# Patient Record
Sex: Male | Born: 1955 | Race: White | Hispanic: No | Marital: Married | Smoking: Never smoker
Health system: Southern US, Community
[De-identification: ages and names within clinical notes are randomized; demographics above are authoritative.]

## PROBLEM LIST (undated history)

## (undated) DIAGNOSIS — E785 Hyperlipidemia, unspecified: Secondary | ICD-10-CM

## (undated) DIAGNOSIS — A6000 Herpesviral infection of urogenital system, unspecified: Secondary | ICD-10-CM

## (undated) HISTORY — DX: Herpesviral infection of urogenital system, unspecified: A60.00

## (undated) HISTORY — DX: Hyperlipidemia, unspecified: E78.5

## (undated) HISTORY — PX: TONSILLECTOMY: SUR1361

---
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
## 1998-02-10 ENCOUNTER — Ambulatory Visit (HOSPITAL_COMMUNITY): Admission: RE | Admit: 1998-02-10 | Discharge: 1998-02-10 | Payer: Self-pay | Admitting: *Deleted

## 2001-09-04 ENCOUNTER — Ambulatory Visit (HOSPITAL_COMMUNITY): Admission: RE | Admit: 2001-09-04 | Discharge: 2001-09-04 | Payer: Self-pay | Admitting: *Deleted

## 2004-04-21 ENCOUNTER — Encounter: Admission: RE | Admit: 2004-04-21 | Discharge: 2004-04-21 | Payer: Self-pay | Admitting: Family Medicine

## 2004-07-20 ENCOUNTER — Ambulatory Visit (HOSPITAL_COMMUNITY): Admission: RE | Admit: 2004-07-20 | Discharge: 2004-07-20 | Payer: Self-pay | Admitting: Neurological Surgery

## 2004-08-06 ENCOUNTER — Encounter: Admission: RE | Admit: 2004-08-06 | Discharge: 2004-08-06 | Payer: Self-pay | Admitting: Neurological Surgery

## 2004-10-17 ENCOUNTER — Emergency Department (HOSPITAL_COMMUNITY): Admission: EM | Admit: 2004-10-17 | Discharge: 2004-10-17 | Payer: Self-pay | Admitting: Family Medicine

## 2004-10-26 ENCOUNTER — Ambulatory Visit (HOSPITAL_COMMUNITY): Admission: RE | Admit: 2004-10-26 | Discharge: 2004-10-26 | Payer: Self-pay | Admitting: Specialist

## 2004-11-01 ENCOUNTER — Ambulatory Visit (HOSPITAL_COMMUNITY): Admission: RE | Admit: 2004-11-01 | Discharge: 2004-11-01 | Payer: Self-pay | Admitting: Specialist

## 2006-08-28 ENCOUNTER — Ambulatory Visit (HOSPITAL_COMMUNITY): Admission: RE | Admit: 2006-08-28 | Discharge: 2006-08-28 | Payer: Self-pay | Admitting: *Deleted

## 2010-01-18 ENCOUNTER — Emergency Department (HOSPITAL_COMMUNITY): Admission: EM | Admit: 2010-01-18 | Discharge: 2010-01-19 | Payer: Self-pay | Admitting: Emergency Medicine

## 2010-09-19 ENCOUNTER — Encounter: Payer: Self-pay | Admitting: Family Medicine

## 2010-11-15 LAB — D-DIMER, QUANTITATIVE: D-Dimer, Quant: 0.22 ug/mL-FEU (ref 0.00–0.48)

## 2011-01-14 NOTE — Procedures (Signed)
[place]. [facility]  Patient:    Crawford, Joe Visit Number: [id number] MRN: [id number]          Service Type: END Location: ENDO Attending Physician:  Sabino Gasser Dictated by:   Sabino Gasser, M.D. Proc. Date: 09/04/01 Admit Date:  09/04/2001                             Procedure Report  PROCEDURE PERFORMED:  Colonoscopy.  ENDOSCOPIST:  Sabino Gasser, M.D.  INDICATIONS FOR PROCEDURE:  Colon polyps.  ANESTHESIA:  Demerol 100 mg, Versed 10 mg.  DESCRIPTION OF PROCEDURE:  With the patient mildly sedated in the left lateral decubitus position, the Olympus videoscopic colonoscope was inserted in the rectum after normal rectal examination and passed to the cecum.  The cecum was identified by the ileocecal valve and appendiceal orifice.  We entered into the terminal ileum which also appeared normal.  From this point, the colonoscope was slowly withdrawn, taking circumferential views of the entire colonic mucosa, stopping in the rectum which appeared normal on direct view and showed possibly small internal hemorrhoids on retroflex view. The endoscope was straightened and withdrawn.  Patients vital signs and pulse oximeter remained stable.  The patient tolerated the procedure well and without apparent complications.  FINDINGS:  Very small internal hemorrhoids.  Otherwise unremarkable examination.  PLAN:  Repeat examination possibly in five years. Dictated by:   Sabino Gasser, M.D. Attending Physician:  Sabino Gasser DD:  09/04/01 TD:  09/04/01 Job: [id number]

## 2011-01-14 NOTE — Op Note (Signed)
NAME:  Joe Crawford, Joe Crawford NO.:  [id number]   MEDICAL RECORD NO.:  [id number]          PATIENT TYPE:  AMB   LOCATION:  ENDO                         FACILITY:  [facility]   PHYSICIAN:  Georgiana Spinner, M.D.    DATE OF BIRTH:  1956/04/11   DATE OF PROCEDURE:  08/28/2006  DATE OF DISCHARGE:  06/26/2006                               OPERATIVE REPORT   PROCEDURE:  Colonoscopy.   ANESTHESIA:  Fentanyl 125 mcg, Versed 12 mg.   PROCEDURE:  With the patient mildly sedated in the left lateral  decubitus position, a rectal examination was attempted and the prostate  felt normal as much as I could palpate on rectal examination.  Subsequently the Pentax videoscopic colonoscope was inserted into the  rectum and passed under direct vision with pressure applied to the  abdomen.  We were able to reach the cecum as identified by ileocecal  valve and appendiceal orifice both which were photographed.  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  The endoscope was straightened and withdrawn.  The patient's vital signs, pulse oximeter remained stable.  The patient  tolerated procedure well without apparent complications.   FINDINGS:  Arteriovenous malformations of hepatic flexure, otherwise  unremarkable examination.   PLAN:  Repeat examination probably in 5 years.           ______________________________  Georgiana Spinner, M.D.     GMO/MEDQ  D:  08/28/2006  T:  08/28/2006  Job:  [id number]   cc:   Dellis Anes. Idell Pickles, M.D.

## 2011-03-15 ENCOUNTER — Other Ambulatory Visit: Payer: Self-pay | Admitting: Family Medicine

## 2011-03-15 ENCOUNTER — Ambulatory Visit
Admission: RE | Admit: 2011-03-15 | Discharge: 2011-03-15 | Disposition: A | Discharge status: Home / Self Care | Payer: BC Managed Care – PPO | Source: Ambulatory Visit | Attending: Family Medicine | Admitting: Family Medicine

## 2011-03-15 DIAGNOSIS — M79669 Pain in unspecified lower leg: Secondary | ICD-10-CM

## 2012-07-14 IMAGING — US US EXTREM LOW VENOUS*R*
1 series · 14 of 24 positions shown · non-contrast
Comparison: None

CLINICAL DATA: Right leg pain and swelling

RIGHT LOWER EXTREMITY VENOUS DOPPLER ULTRASOUND
TECHNIQUE: Gray-scale sonography with compression, as well as color
and duplex ultrasound, were performed to evaluate the deep venous
system from the level of the common femoral vein through the
popliteal and proximal calf veins.

[Series 1: us extrem low venous*right* · 14 of 31 slices shown]
[im 1/31]
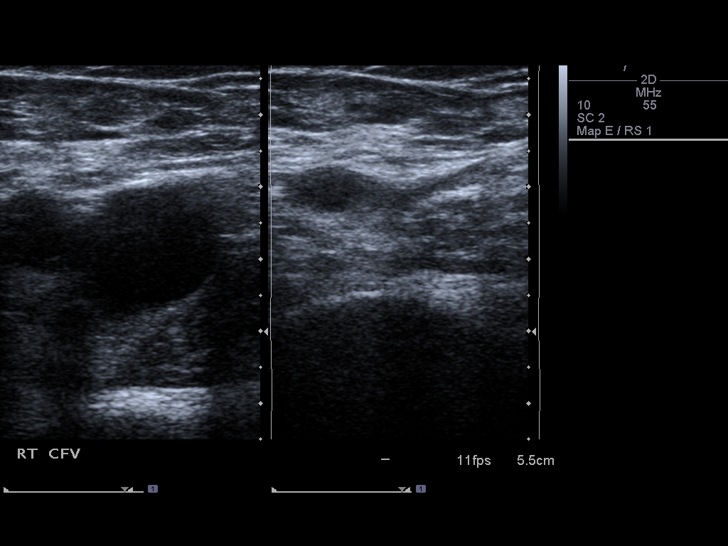
[im 3/31]
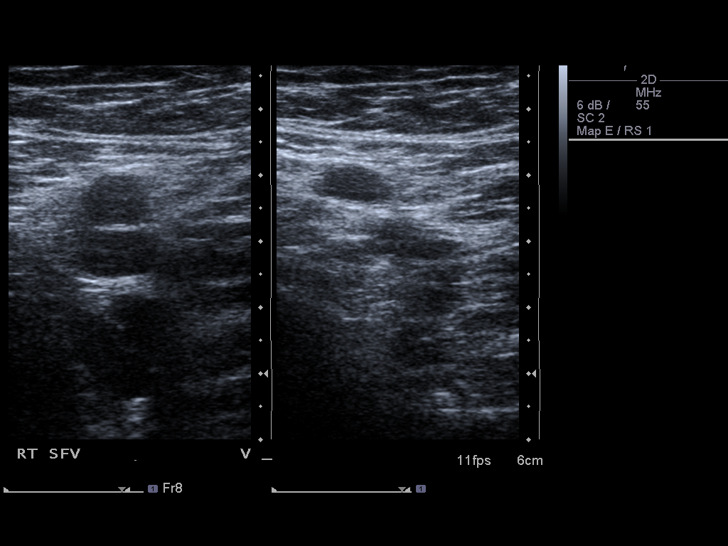
[im 6/31]
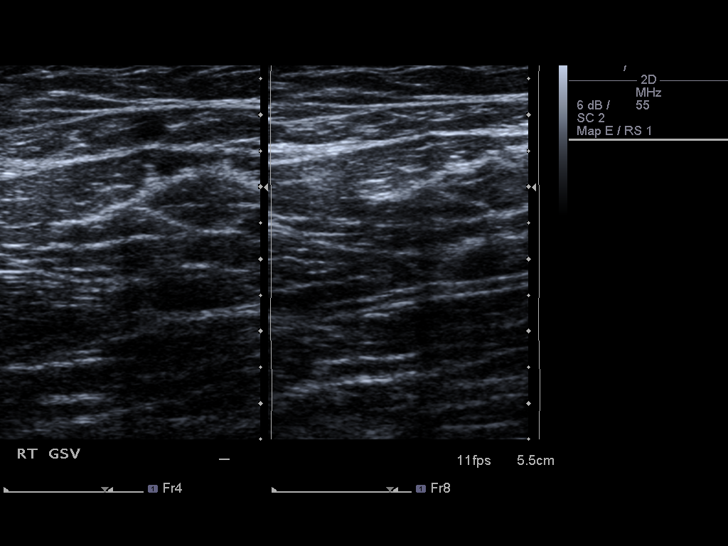
[im 8/31]
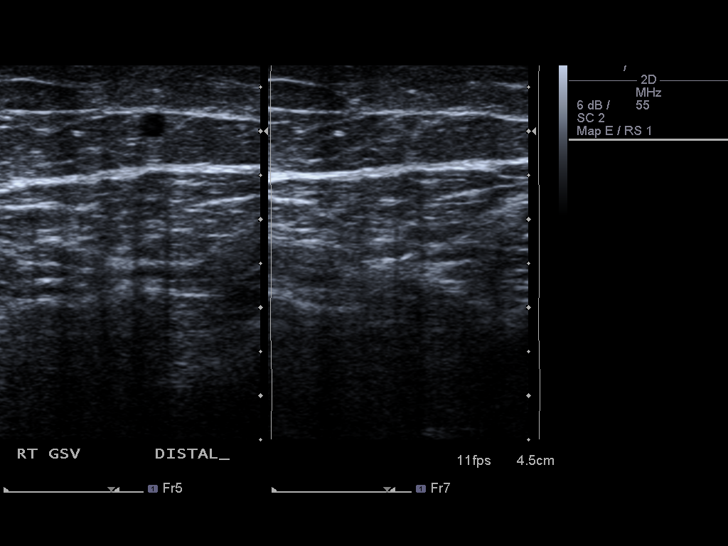
[im 10/31]
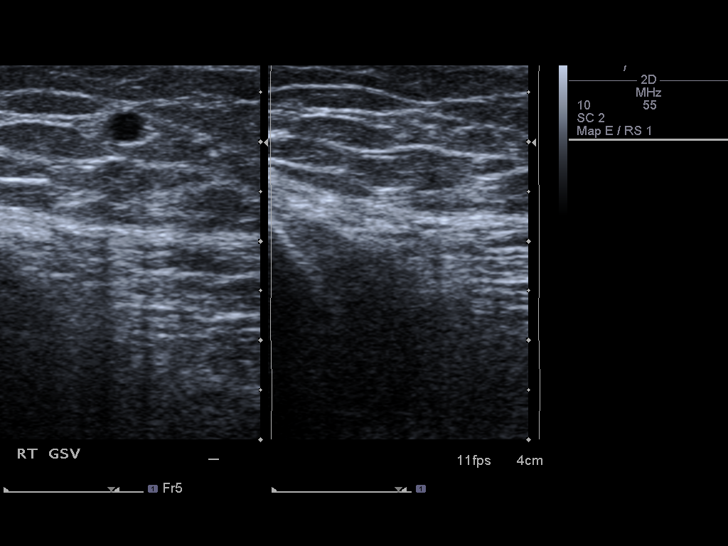
[im 12/31]
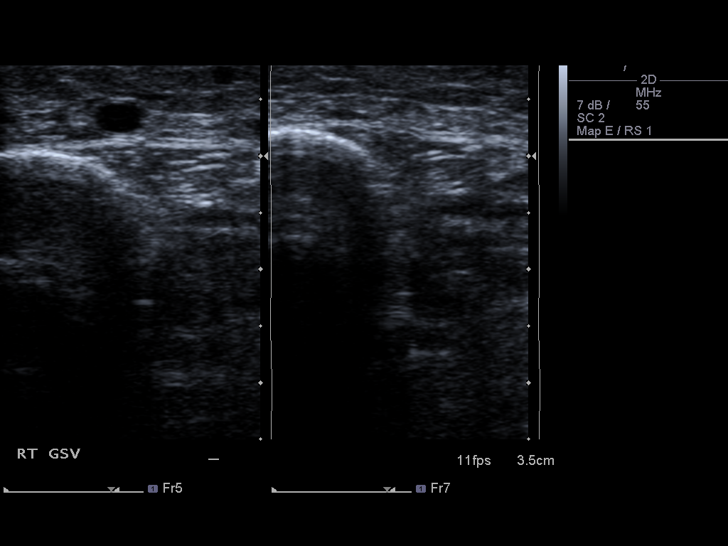
[im 15/31]
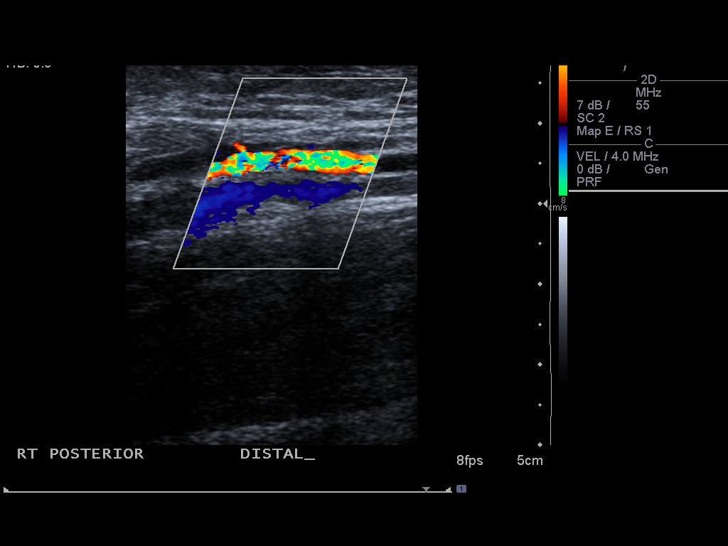
[im 16/31]
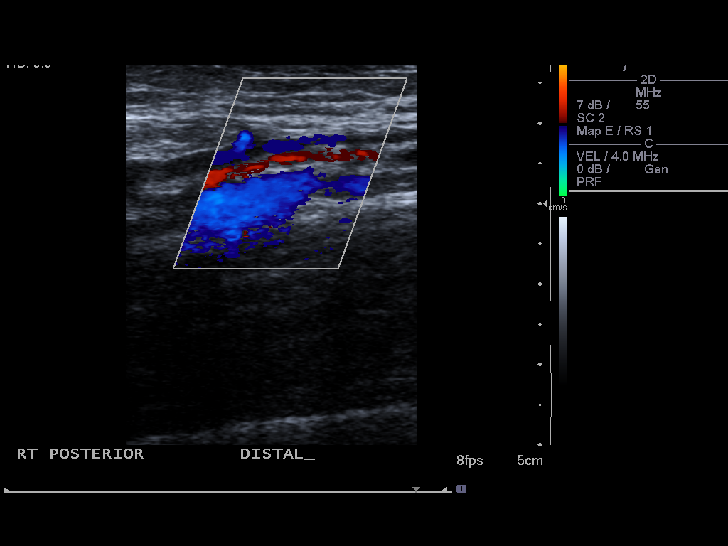
[im 19/31]
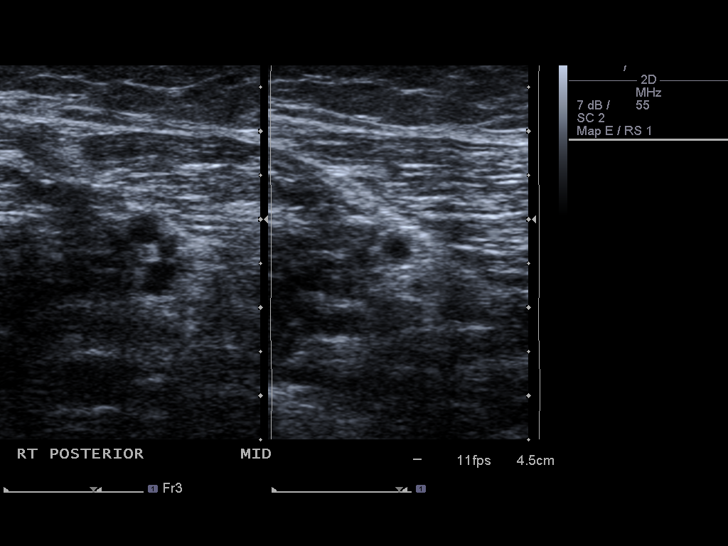
[im 21/31]
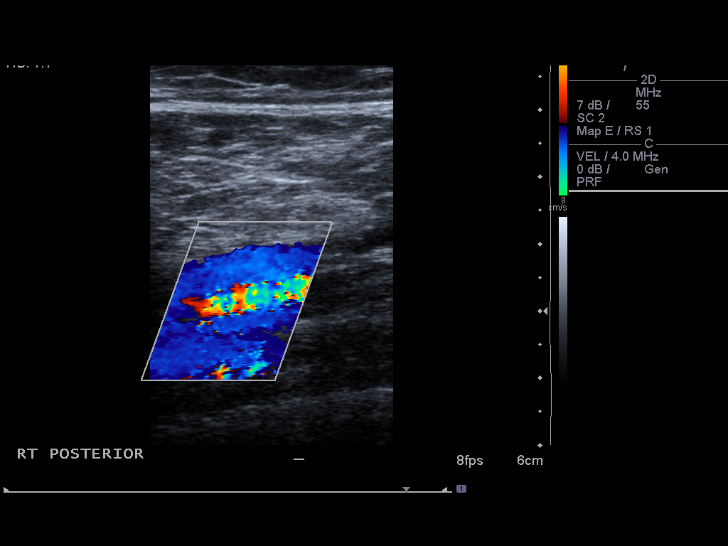
[im 24/31]
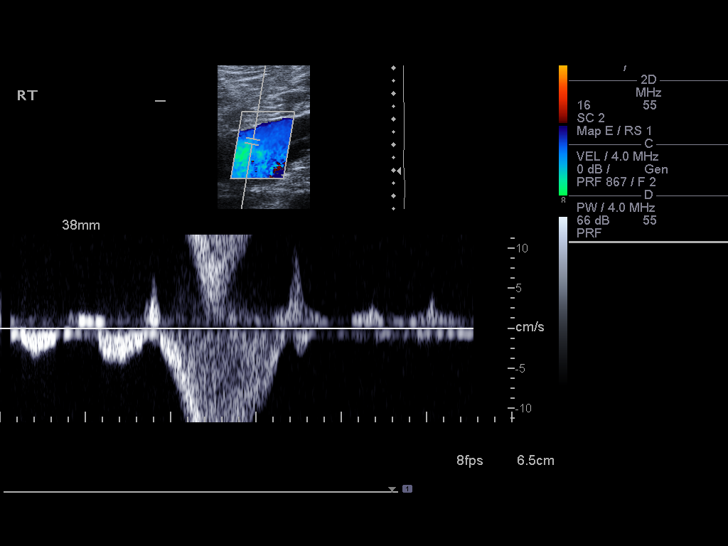
[im 25/31]
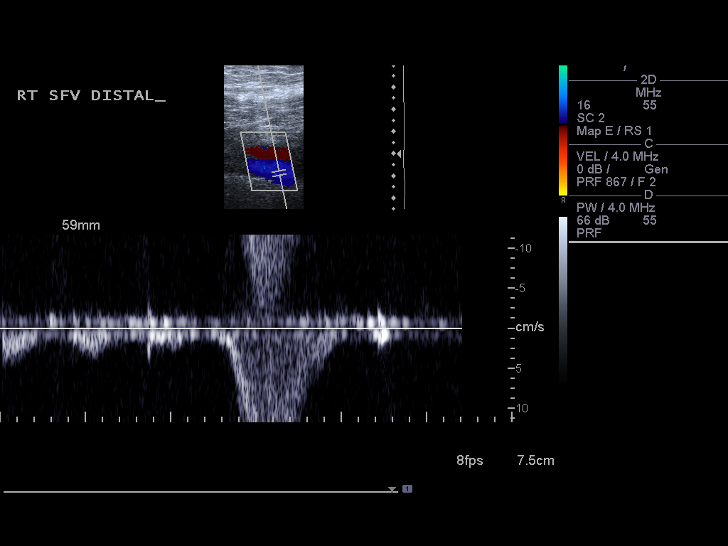
[im 28/31]
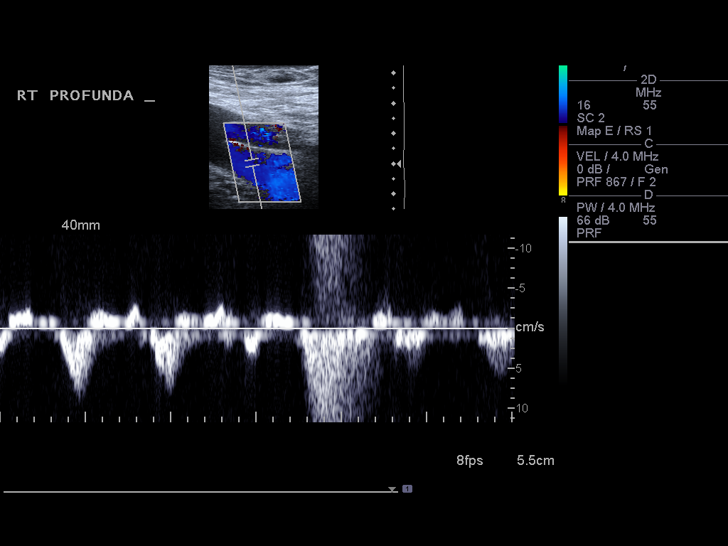
[im 31/31]
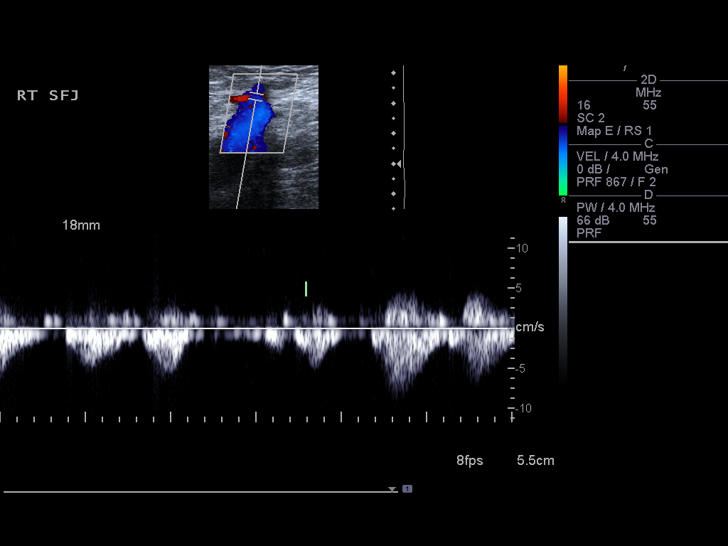

[14 of 24 positions shown; findings below may reference images not displayed]

FINDINGS: Normal compressibility of  the common femoral,
superficial femoral, and popliteal veins, as well as the proximal
calf veins.  No filling defects to suggest DVT on grayscale or
color Doppler imaging.  Doppler waveforms show normal direction of
venous flow, normal respiratory phasicity and response to
augmentation.
IMPRESSION: No evidence of  lower extremity deep vein thrombosis.

## 2015-12-28 DIAGNOSIS — M65342 Trigger finger, left ring finger: Secondary | ICD-10-CM | POA: Insufficient documentation

## 2015-12-28 DIAGNOSIS — S63032A Subluxation of midcarpal joint of left wrist, initial encounter: Secondary | ICD-10-CM | POA: Insufficient documentation

## 2015-12-28 DIAGNOSIS — M25532 Pain in left wrist: Secondary | ICD-10-CM | POA: Insufficient documentation

## 2018-05-10 ENCOUNTER — Ambulatory Visit: Payer: Managed Care, Other (non HMO) | Admitting: Podiatry

## 2018-05-10 ENCOUNTER — Ambulatory Visit (INDEPENDENT_AMBULATORY_CARE_PROVIDER_SITE_OTHER): Payer: Managed Care, Other (non HMO)

## 2018-05-10 ENCOUNTER — Encounter

## 2018-05-10 DIAGNOSIS — M722 Plantar fascial fibromatosis: Secondary | ICD-10-CM

## 2018-05-10 MED ORDER — TRIAMCINOLONE ACETONIDE 10 MG/ML IJ SUSP
10.0000 mg | Freq: Once | INTRAMUSCULAR | Status: AC
  Administered 2018-05-10: 10 mg

## 2018-05-10 MED ORDER — MELOXICAM 15 MG PO TABS: 15.0000 mg | ORAL_TABLET | Freq: Every day | ORAL | 2 refills | Status: DC

## 2018-05-10 NOTE — Patient Instructions (Signed)

## 2018-05-11 DIAGNOSIS — M722 Plantar fascial fibromatosis: Secondary | ICD-10-CM | POA: Insufficient documentation

## 2018-05-11 NOTE — Progress Notes (Signed)
Subjective:   Patient ID: Joe Crawford, male   DOB: 62 y.o.   MRN: [id number]   HPI 62 year old male presents the office today for concerns of pain to the bottom of his left heel which is been ongoing the last 2 weeks he describes a throbbing sensation in the morning when he first gets up.  He denies any recent injury or trauma.  He thinks that some inserts that he started wearing because some of the discomfort.  No swelling or redness.  No numbness or tingling.  The pain does not wake him up at night.  No other concerns.   Review of Systems  All other systems reviewed and are negative.  No past medical history on file.     Current Outpatient Medications:  .  valACYclovir (VALTREX) 500 MG tablet, TAKE 1 TABLET BY MOUTH EVERY DAY, Disp: , Rfl:  .  lovastatin (MEVACOR) 20 MG tablet, 1 TABLET WITH A MEAL ONCE A DAY ORALLY 90, Disp: , Rfl: 3 .  meloxicam (MOBIC) 15 MG tablet, Take 1 tablet (15 mg total) by mouth daily., Disp: 30 tablet, Rfl: 2  Allergies  Allergen Reactions  . Penicillins Rash    UNSURE IF REAL ALLERGY,  PATIENT DOES NOT WANT TO TAKE A CHANCE TAKING MEDICATION AND HAVING A REACTION unknown   . Sulfamethoxazole Rash          Objective:  Physical Exam  General: AAO x3, NAD  Dermatological: Skin is warm, dry and supple bilateral. Nails x 10 are well manicured; remaining integument appears unremarkable at this time. There are no open sores, no preulcerative lesions, no rash or signs of infection present.  Vascular: Dorsalis Pedis artery and Posterior Tibial artery pedal pulses are 2/4 bilateral with immedate capillary fill time. Pedal hair growth present. No varicosities and no lower extremity edema present bilateral. There is no pain with calf compression, swelling, warmth, erythema.   Neruologic: Grossly intact via light touch bilateral. Protective threshold with Semmes Wienstein monofilament intact to all pedal sites bilateral.  Negative Tinel  sign.  Musculoskeletal: Tenderness to palpation along the plantar medial tubercle of the calcaneus at the insertion of plantar fascia on the left foot. There is no pain along the course of the plantar fascia within the arch of the foot. Plantar fascia appears to be intact. There is no pain with lateral compression of the calcaneus or pain with vibratory sensation. There is no pain along the course or insertion of the achilles tendon. No other areas of tenderness to bilateral lower extremities.Muscular strength 5/5 in all groups tested bilateral.  Gait: Unassisted, Nonantalgic.       Assessment:   Left foot plantar fasciitis     Plan:  -Treatment options discussed including all alternatives, risks, and complications -Etiology of symptoms were discussed -X-rays were obtained and reviewed with the patient.  No evidence of acute fracture or stress fracture identified today. -Steroid injection performed.  See procedure note below -Plantar fascial brace dispensed. -Prescribed mobic. Discussed side effects of the medication and directed to stop if any are to occur and call the office.  -Discussed stretching, icing exercises daily. -Discussed shoe modifications and orthotics  Procedure: Injection Tendon/Ligament Discussed alternatives, risks, complications and verbal consent was obtained.  Location: Left plantar fascia at the glabrous junction; medial approach. Skin Prep: Alcohol. Injectate: 0.5cc 0.5% marcaine plain, 0.5 cc 2% lidocaine plain and, 1 cc kenalog 10. Disposition: Patient tolerated procedure well. Injection site dressed with a band-aid.  Post-injection care  was discussed and return precautions discussed.   Return in about 3 weeks (around 05/31/2018).  Vivi Barrack DPM

## 2018-05-14 ENCOUNTER — Ambulatory Visit: Payer: Self-pay | Admitting: Podiatry

## 2018-05-31 ENCOUNTER — Ambulatory Visit: Payer: Managed Care, Other (non HMO) | Admitting: Podiatry

## 2018-06-13 ENCOUNTER — Ambulatory Visit: Payer: Managed Care, Other (non HMO) | Admitting: Podiatry

## 2018-06-13 ENCOUNTER — Encounter: Payer: Self-pay | Admitting: Podiatry

## 2018-06-13 DIAGNOSIS — M722 Plantar fascial fibromatosis: Secondary | ICD-10-CM

## 2018-06-13 DIAGNOSIS — M79672 Pain in left foot: Secondary | ICD-10-CM

## 2018-06-13 NOTE — Progress Notes (Signed)
  Subjective:  Patient ID: Joe Crawford, male    DOB: [birth year],  MRN: [id number]  Chief Complaint  Patient presents with  . Plantar Fasciitis    left foot follow up; pt stated, "still hurts but havn't been wearing insoles or stretching like i should"    62 y.o. male presents with the above complaint. Pain is much improved but still having pain.  Review of Systems: Negative except as noted in the HPI. Denies N/V/F/Ch.  History reviewed. No pertinent past medical history.  Current Outpatient Medications:  .  lovastatin (MEVACOR) 20 MG tablet, 1 TABLET WITH A MEAL ONCE A DAY ORALLY 90, Disp: , Rfl: 3 .  meloxicam (MOBIC) 15 MG tablet, Take 1 tablet (15 mg total) by mouth daily., Disp: 30 tablet, Rfl: 2 .  valACYclovir (VALTREX) 500 MG tablet, TAKE 1 TABLET BY MOUTH EVERY DAY, Disp: , Rfl:   Social History   Tobacco Use  Smoking Status Never Smoker  Smokeless Tobacco Never Used    Allergies  Allergen Reactions  . Penicillins Rash    UNSURE IF REAL ALLERGY,  PATIENT DOES NOT WANT TO TAKE A CHANCE TAKING MEDICATION AND HAVING A REACTION unknown   . Sulfamethoxazole Rash   Objective:  There were no vitals filed for this visit. There is no height or weight on file to calculate BMI. Constitutional Well developed. Well nourished.  Vascular Dorsalis pedis pulses palpable bilaterally. Posterior tibial pulses palpable bilaterally. Capillary refill normal to all digits.  No cyanosis or clubbing noted. Pedal hair growth normal.  Neurologic Normal speech. Oriented to person, place, and time. Epicritic sensation to light touch grossly present bilaterally.  Dermatologic Nails well groomed and normal in appearance. No open wounds. No skin lesions.  Orthopedic: Normal joint ROM without pain or crepitus bilaterally. No visible deformities. Tender to palpation at the calcaneal tuber left. No pain with calcaneal squeeze left.   Radiographs: None  Assessment:   1. Plantar  fasciitis   2. Pain of left heel    Plan:  Patient was evaluated and treated and all questions answered.  Plantar Fasciitis, left - Continue icing and stretching - Injection delivered to the plantar fascia as below.  Procedure: Injection Tendon/Ligament Location: Left plantar fascia at the glabrous junction; medial approach. Skin Prep: alcohol Injectate: 1 cc 0.5% marcaine plain, 1 cc dexamethasone phosphate, 0.5 cc kenalog 10. Disposition: Patient tolerated procedure well. Injection site dressed with a band-aid.  No follow-ups on file.

## 2019-02-21 ENCOUNTER — Other Ambulatory Visit: Payer: Self-pay

## 2019-02-21 ENCOUNTER — Encounter: Payer: Self-pay | Admitting: Allergy

## 2019-02-21 ENCOUNTER — Ambulatory Visit: Payer: Managed Care, Other (non HMO) | Admitting: Allergy

## 2019-02-21 VITALS — BP 140/86 | HR 67 | Temp 98.5°F | Resp 16 | Ht 71.0 in | Wt 242.4 lb

## 2019-02-21 DIAGNOSIS — R059 Cough, unspecified: Secondary | ICD-10-CM

## 2019-02-21 DIAGNOSIS — J3089 Other allergic rhinitis: Secondary | ICD-10-CM | POA: Insufficient documentation

## 2019-02-21 DIAGNOSIS — T781XXD Other adverse food reactions, not elsewhere classified, subsequent encounter: Secondary | ICD-10-CM

## 2019-02-21 DIAGNOSIS — T781XXA Other adverse food reactions, not elsewhere classified, initial encounter: Secondary | ICD-10-CM | POA: Insufficient documentation

## 2019-02-21 DIAGNOSIS — R05 Cough: Secondary | ICD-10-CM | POA: Insufficient documentation

## 2019-02-21 MED ORDER — AZELASTINE HCL 0.15 % NA SOLN: NASAL | 5 refills | Status: DC

## 2019-02-21 NOTE — Assessment & Plan Note (Addendum)
Noticed some increased mucous production after eating certain foods on the plane but can eat same food at home with no issues. This is not a typical presentation of IgE mediated reactions which is what skin testing is for.   Today's skin testing was negative to common foods.  Monitor symptoms and bring a list of foods/ingredients that cause this issue in the future.

## 2019-02-21 NOTE — Assessment & Plan Note (Addendum)
Dry coughing which cleared up with Zithromax and prednisone significantly. Still has few episodes throughout the day. No other respiratory symptoms.  Today's spirometry was normal.  This coughing is most likely from PND. See plan as above for allergic rhinitis. If not improved then will consider a trial of heartburn medication as well.

## 2019-02-21 NOTE — Assessment & Plan Note (Signed)
Perennial rhinitis symptoms for many years mainly occurring in the mornings. Tried saline spray with some benefit. No previous allergy testing or ENT evaluation.  Today's skin testing showed: Positive to molds.  Start environmental control measures.   Start azelastine nasal spray 1-2 sprays in each nostril 1-2 times a day as needed for drainage. This has minimal systemic absorption.  Nasal saline spray (i.e., Simply Saline) or nasal saline lavage (i.e., NeilMed) is recommended as needed and prior to medicated nasal sprays.  Handout given on saline lavage - try to use 1-2 times a day.  If this regimen does not control symptoms then will try Atrovent nasal spray next and refer to ENT.

## 2019-02-21 NOTE — Progress Notes (Signed)
New Patient Note  RE: Joe Crawford MRN: [id number] DOB: Jan 14, 1956 Date of Office Visit: 02/21/2019  Referring provider: Catha GosselinLittle, Kevin, MD Primary care provider: Catha GosselinLittle, Kevin, MD  Chief Complaint: Sinus Problem (daily) and Cough  History of Present Illness: I had the pleasure of seeing Joe Crawford for initial evaluation at the [facility] on 02/21/2019. He is a 63 y.o. male, who is referred here by Joe GosselinLittle, Kevin, MD for the evaluation of chronic cough, sinus issues.  Sinus:  He reports symptoms of yellow/brown sinus discharge, some nasal congestion mainly in the mornings. Symptoms have been going on for many years and persistent The symptoms are present all year around. Other triggers include exposure to unknown. Anosmia: no. Headache: no. He has used saline spray with some improvement in symptoms. 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 Sinus infections: not recently. Previous work up includes: none. Previous ENT evaluation: no. Previous sinus imaging: no. History of nasal polyps: no.  Resp: He reports symptoms of dry coughing for many years but cleared up with Zithromax and prednisone. Current medications include none. He tried the following inhalers: none. Main triggers are unsure. Interference with physical activity: no. Sleep is undisturbed.In the last 12 months, oral steroids courses: one course .History of pneumonia: no. He was not evaluated by allergist/pulmonologist in the past. Smoking exposure: no. History of reflux: yes but not on medications.  Assessment and Plan: [name] is a 63 y.o. male with: Other allergic rhinitis Perennial rhinitis symptoms for many years mainly occurring in the mornings. Tried saline spray with some benefit. No previous allergy testing or ENT evaluation.  Today's skin testing showed: Positive to molds.  Start environmental control measures.   Start azelastine nasal spray 1-2 sprays in each nostril 1-2 times  a day as needed for drainage. This has minimal systemic absorption.  Nasal saline spray (i.e., Simply Saline) or nasal saline lavage (i.e., NeilMed) is recommended as needed and prior to medicated nasal sprays.  Handout given on saline lavage - try to use 1-2 times a day.  If this regimen does not control symptoms then will try Atrovent nasal spray next and refer to ENT.  Coughing Dry coughing which cleared up with Zithromax and prednisone significantly. Still has few episodes throughout the day. No other respiratory symptoms.  Today's spirometry was normal.  This coughing is most likely from PND. See plan as above for allergic rhinitis. If not improved then will consider a trial of heartburn medication as well.  Adverse food reaction Noticed some increased mucous production after eating certain foods on the plane but can eat same food at home with no issues. This is not a typical presentation of IgE mediated reactions which is what skin testing is for.   Today's skin testing was negative to common foods.  Monitor symptoms and bring a list of foods/ingredients that cause this issue in the future.   Return in about 2 months (around 04/23/2019).  Meds ordered this encounter  Medications  . Azelastine HCl 0.15 % SOLN    Sig: Take 1-2 sprays per nostril 1-2 times a day as needed for post nasal drip.    Dispense:  30 mL    Refill:  5   Other allergy screening: Food allergy: no  Some increased mucous production with certain sandwich that is served in the plane but tolerates other sandwiches at home with no issues.  Medication allergy: yes  Penicillin - rash Sulfa - rash  Hymenoptera allergy: no Urticaria: no Eczema:no History of recurrent infections suggestive of immunodeficency: no  Diagnostics: Spirometry:  Tracings reviewed. His effort: Good reproducible efforts. FVC: 4.83L FEV1: 3.43L, 93% predicted FEV1/FVC ratio: 71% Interpretation: Spirometry consistent with normal  pattern.  Please see scanned spirometry results for details.  Skin Testing: Environmental allergy panel and select foods. Positive test to: molds. Negative test to: common foods.  Results discussed with patient/family. Airborne Adult Perc - 02/21/19 1400    Time Antigen Placed  1400    Allergen Manufacturer  Greer    Location  Back    Number of Test  59    Panel 1  Select    1. Control-Buffer 50% Glycerol  Negative    2. Control-Histamine 1 mg/ml  3+    3. Albumin saline  Negative    4. Bahia  Negative    5. French Southern Territories[place]ermuda  Negative    6. Johnson  Negative    7. Kentucky Blue  Negative    8. Meadow Fescue  Negative    9. Perennial Rye  Negative    10. Sweet Vernal  Negative    11. Timothy  Negative    12. Cocklebur  Negative    13. Burweed Marshelder  Negative    14. Ragweed, short  Negative    15. Ragweed, Giant  Negative    16. Plantain,  English  Negative    17. Lamb's Quarters  Negative    18. Sheep Sorrell  Negative    19. Rough Pigweed  Negative    20. Marsh Elder, Rough  Negative    21. Mugwort, Common  Negative    22. Ash mix  Negative    23. Birch mix  Negative    24. Beech American  Negative    25. Box, Elder  Negative    26. Cedar, red  Negative    27. Cottonwood, Guinea-BissauEastern  Negative    28. Elm mix  Negative    29. Hickory mix  Negative    30. Maple mix  Negative    31. Oak, Guinea-Bissau[place]astern mix  Negative    32. Pecan Pollen  Negative    33. Pine mix  Negative    34. Sycamore Eastern  Negative    35. Walnut, Black Pollen  Negative    36. Alternaria alternata  Negative    37. Cladosporium Herbarum  Negative    38. Aspergillus mix  Negative    39. Penicillium mix  Negative    40. Bipolaris sorokiniana (Helminthosporium)  Negative    41. Drechslera spicifera (Curvularia)  Negative    42. Mucor plumbeus  Negative    43. Fusarium moniliforme  Negative    44. Aureobasidium pullulans (pullulara)  Negative    45. Rhizopus oryzae  Negative    46. Botrytis cinera  Negative     47. Epicoccum nigrum  Negative    48. Phoma betae  Negative    49. Candida Albicans  Negative    50. Trichophyton mentagrophytes  Negative    51. Mite, D Farinae  5,000 AU/ml  Negative    52. Mite, D Pteronyssinus  5,000 AU/ml  Negative    53. Cat Hair 10,000 BAU/ml  Negative    54.  Dog Epithelia  Negative    55. Mixed Feathers  Negative    56. Horse Epithelia  Negative    57. Cockroach, German  Negative    58. Mouse  Negative    59. Tobacco Leaf  Negative     Food Perc - 02/21/19 1400    Time Antigen Placed  1400    Allergen Manufacturer  Lavella Hammock    Location  Back    Number of allergen test  10    Food  Select    1. Peanut  Negative    2. Soybean food  Negative    3. Wheat, whole  Negative    4. Sesame  Negative    5. Milk, cow  Negative    6. Egg White, chicken  Negative    7. Casein  Negative    8. Shellfish mix  Negative    9. Fish mix  Negative    10. Cashew  Negative     Intradermal - 02/21/19 1434    Time Antigen Placed  1434    Allergen Manufacturer  Lavella Hammock    Location  Arm    Number of Test  15    Intradermal  Select    Control  Negative    Guatemala  Negative    Johnson  Negative    7 Grass  Negative    Ragweed mix  Negative    Weed mix  Negative    Tree mix  Negative    Mold 1  Negative    Mold 2  2+    Mold 3  Negative    Mold 4  3+    Cat  Negative    Dog  Negative    Cockroach  Negative    Mite mix  Negative       Past Medical History: Patient Active Problem List   Diagnosis Date Noted  . Coughing 02/21/2019  . Other allergic rhinitis 02/21/2019  . Adverse food reaction 02/21/2019  . Plantar fasciitis 05/11/2018  . Left wrist pain 12/28/2015  . 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 Trigger ring finger of left hand 12/28/2015   Past Medical History:  Diagnosis Date  . Herpes genitalia   . Hyperlipidemia    Past Surgical History: Past Surgical History:  Procedure Laterality Date  . TONSILECTOMY, ADENOIDECTOMY,  BILATERAL MYRINGOTOMY AND TUBES  1966   Medication List:  Current Outpatient Medications  Medication Sig Dispense Refill  . lovastatin (MEVACOR) 20 MG tablet 1 TABLET WITH A MEAL ONCE A DAY ORALLY 90  3  . valACYclovir (VALTREX) 500 MG tablet TAKE 1 TABLET BY MOUTH EVERY DAY    . Azelastine HCl 0.15 % SOLN Take 1-2 sprays per nostril 1-2 times a day as needed for post nasal drip. 30 mL 5   No current facility-administered medications for this visit.    Allergies: Allergies  Allergen Reactions  . Penicillins Rash    UNSURE IF REAL ALLERGY,  PATIENT DOES NOT WANT TO TAKE A CHANCE TAKING MEDICATION AND HAVING A REACTION unknown   . Sulfamethoxazole Rash   Social History: Social History   Socioeconomic History  . Marital status: Married    Spouse name: Not on file  . Number of children: Not on file  . Years of education: Not on file  . Highest education level: Not on file  Occupational History  . Occupation: Insurance underwriter    Comment: Aeronautical engineer  Social Needs  . Financial resource strain: Not on file  . Food insecurity    Worry: Not on file    Inability: Not on file  . Transportation needs    Medical: Not on file    Non-medical: Not on file  Tobacco Use  . Smoking status: Never Smoker  . Smokeless tobacco: Never Used  Substance and Sexual Activity  . Alcohol use: Never    Frequency: Never  . Drug use: Never  . Sexual activity: Not on file  Lifestyle  . Physical activity    Days per week: Not on file    Minutes per session: Not on file  . Stress: Not on file  Relationships  . Social Musician on phone: Not on file    Gets together: Not on file    Attends religious service: Not on file    Active member of club or organization: Not on file    Attends meetings of clubs or organizations: Not on file    Relationship status: Not on file  Other Topics Concern  . Not on file  Social History Narrative  . Not on file   Lives in a house which is about 61  years old. Smoking: denies. Occupation: Barista HistorySurveyor, minerals in the house: no Engineer, civil (consulting) in the family room: yes Carpet in the bedroom: yes Heating: gas Cooling: central Pet: no  Family History: Family History  Problem Relation Age of Onset  . Atrial fibrillation Mother   . Healthy Father   . Healthy Sister   . Healthy Brother    Problem                               Relation Asthma                                   No  Eczema                                No  Food allergy                          No  Allergic rhino conjunctivitis     No   Review of Systems  Constitutional: Negative for appetite change, chills, fever and unexpected weight change.  HENT: Negative for congestion and rhinorrhea.   Eyes: Negative for itching.  Respiratory: Positive for cough. Negative for chest tightness, shortness of breath and wheezing.   Cardiovascular: Negative for chest pain.  Gastrointestinal: Negative for abdominal pain.  Genitourinary: Negative for difficulty urinating.  Skin: Negative for rash.  Allergic/Immunologic: Negative for environmental allergies and food allergies.  Neurological: Negative for headaches.   Objective: BP 140/86 (BP Location: Left Arm, Cuff Size: Normal)   Pulse 67   Temp 98.5 F (36.9 C) (Temporal)   Resp 16   Ht  (1.803 m)   Wt 242 lb 6.4 oz (110 kg)   SpO2 97%   BMI 33.81 kg/m  Body mass index is 33.81 kg/m. Physical Exam  Constitutional: He is oriented to person, place, and time. He appears well-developed and well-nourished.  HENT:  Head: Normocephalic and atraumatic.  Right Ear: External ear normal.  Left Ear: External ear normal.  Nose: Nose normal.  Mouth/Throat: Oropharynx is clear and moist.  Some mild cobblestoning on the pharynx  Eyes: Conjunctivae and EOM are normal.  Neck: Neck supple.  Cardiovascular: Normal rate, regular rhythm and normal heart sounds. Exam reveals no gallop and no friction rub.   No murmur heard.  Pulmonary/Chest: Effort normal and breath sounds normal. He has no wheezes. He has no rales.  Abdominal: Soft.  Neurological: He is alert and oriented to person, place, and time.  Skin: Skin is warm. No rash noted.  Psychiatric: He has a normal mood and affect. His behavior is normal.  Nursing note and vitals reviewed.  The plan was reviewed with the patient/family, and all questions/concerned were addressed.  It was my pleasure to see [name] today and participate in his care. Please feel free to contact me with any questions or concerns.  Sincerely,  Wyline MoodYoon , DO Allergy & Immunology  [facility] [place] office: [phone] [facility] office: [phone]

## 2019-02-21 NOTE — Patient Instructions (Addendum)
Today's skin testing showed: Positive to molds   Start azelastine nasal spray 1-2 sprays in each nostril 1-2 times a day as needed for drainage.   Nasal saline spray (i.e., Simply Saline) or nasal saline lavage (i.e., NeilMed) is recommended as needed and prior to medicated nasal sprays.  Your breathing test looked good today. Monitor symptoms.  Food: If you have this issue again, write down what you had to eat and the ingredient list.   Follow up in 2 months  Mold Control . Mold and fungi can grow on a variety of surfaces provided certain temperature and moisture conditions exist.  . Outdoor molds grow on plants, decaying vegetation and soil. The major outdoor mold, Alternaria and Cladosporium, are found in very high numbers during hot and dry conditions. Generally, a late summer - fall peak is seen for common outdoor fungal spores. Rain will temporarily lower outdoor mold spore count, but counts rise rapidly when the rainy period ends. . The most important indoor molds are Aspergillus and Penicillium. Dark, humid and poorly ventilated basements are ideal sites for mold growth. The next most common sites of mold growth are the bathroom and the kitchen. Outdoor (Seasonal) Mold Control . Use air conditioning and keep windows closed. . Avoid exposure to decaying vegetation. Marland Kitchen Avoid leaf raking. . Avoid grain handling. . Consider wearing a face mask if working in moldy areas.  Indoor (Perennial) Mold Control  . Maintain humidity below 50%. . Get rid of mold growth on hard surfaces with water, detergent and, if necessary, 5% bleach (do not mix with other cleaners). Then dry the area completely. If mold covers an area more than 10 square feet, consider hiring an indoor environmental professional. . For clothing, washing with soap and water is best. If moldy items cannot be cleaned and dried, throw them away. . Remove sources e.g. contaminated carpets. . Repair and seal leaking roofs or  pipes. Using dehumidifiers in damp basements may be helpful, but empty the water and clean units regularly to prevent mildew from forming. All rooms, especially basements, bathrooms and kitchens, require ventilation and cleaning to deter mold and mildew growth. Avoid carpeting on concrete or damp floors, and storing items in damp areas.

## 2019-06-17 ENCOUNTER — Encounter: Payer: Self-pay | Admitting: Allergy

## 2019-06-17 ENCOUNTER — Ambulatory Visit (INDEPENDENT_AMBULATORY_CARE_PROVIDER_SITE_OTHER): Payer: Managed Care, Other (non HMO) | Admitting: Allergy

## 2019-06-17 ENCOUNTER — Other Ambulatory Visit: Payer: Self-pay

## 2019-06-17 VITALS — BP 150/90 | HR 66 | Temp 98.2°F | Resp 16 | Ht 71.26 in | Wt 231.8 lb

## 2019-06-17 DIAGNOSIS — R05 Cough: Secondary | ICD-10-CM | POA: Diagnosis not present

## 2019-06-17 DIAGNOSIS — J3089 Other allergic rhinitis: Secondary | ICD-10-CM

## 2019-06-17 DIAGNOSIS — T781XXD Other adverse food reactions, not elsewhere classified, subsequent encounter: Secondary | ICD-10-CM | POA: Diagnosis not present

## 2019-06-17 DIAGNOSIS — R059 Cough, unspecified: Secondary | ICD-10-CM

## 2019-06-17 NOTE — Assessment & Plan Note (Signed)
Past history - Noticed some increased mucous production after eating certain foods on the plane but can eat same food at home with no issues. 2020 skin testing was negative to common foods. Interim history - Noticed this issue after eating the chicken from the airplane. Tolerates chicken otherwise.  Avoid eating the "airplane chicken". Asked patient to get ingredient list.

## 2019-06-17 NOTE — Patient Instructions (Addendum)
Other allergic rhinitis Past skin testing showed: Positive to molds.  Continue environmental control measures.   May use azelastine nasal spray 1-2 sprays in each nostril 1-2 times a day as needed for drainage.  Nasal saline spray (i.e., Simply Saline) or nasal saline lavage (i.e., NeilMed) is recommended as needed and prior to medicated nasal sprays.  Coughing  Monitor symptoms. If it worsens let us know.   Adverse food reaction  Avoiding eating the "airplane chicken".   If you can get an ingredient list that would be great.   Follow up in 12 months or sooner if needed.

## 2019-06-17 NOTE — Progress Notes (Signed)
Follow Up Note  RE: Joe Crawford MRN: [id number] DOB: 09-12-1955 Date of Office Visit: 06/17/2019  Referring provider: Catha Gosselin, MD Primary care provider: Catha Gosselin, MD  Chief Complaint: Cough (just getting over a cold, but okay today) and Allergic Rhinitis   History of Present Illness: I had the pleasure of seeing Joe Crawford for a follow up visit at the [facility] on 06/17/2019. He is a 63 y.o. male, who is being followed for allergic rhinitis, coughing and adverse food reaction. Today he is here for regular follow up visit. His previous allergy office visit was on 02/21/2019 with Dr. Selena Batten.   Other allergic rhinitis Using azelastine as needed with some benefit. Not as much PND.   Coughing Patient is getting over a cold now.  Negative COVID testing recently. 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  Still having some dry coughing and wonders if it's from snoring.  Patient is trying to lose weight.  No reflux/heartburn symptoms.   Adverse food reaction Patient had airplane chicken with mustard which caused the coughing and mucous production. He has been eating chicken on the ground with no issues. He also had the mustard with no issues. He has been eating the fish dish offered when he is flying and had no issues with that.   Noticed some itching on the back. Wondering if it's from the skin testing.  Assessment and Plan: Joe Crawford is a 63 y.o. male with: Other allergic rhinitis Past history - Perennial rhinitis symptoms for many years mainly occurring in the mornings. Tried saline spray with some benefit. No previous ENT evaluation. 2020 skin testing showed: Positive to molds. Interim history - using azelastine prn with good benefit.   Continue environmental control measures.   May use azelastine nasal spray 1-2 sprays in each nostril 1-2 times a day as needed  for drainage.  Nasal saline spray (i.e., Simply Saline) or nasal saline lavage (i.e., NeilMed) is recommended as needed and prior to medicated nasal sprays.  If this regimen does not control symptoms then will try Atrovent nasal spray next and refer to ENT.  Coughing Past history - Dry coughing which cleared up with Zithromax and prednisone significantly. Still has few episodes throughout the day. No other respiratory symptoms. 2020 spirometry was normal. Interim history - Minimal coughing. Denies any heartburn symptoms.   Monitor symptoms.   Adverse food reaction Past history - Noticed some increased mucous production after eating certain foods on the plane but can eat same food at home with no issues. 2020 skin testing was negative to common foods. Interim history - Noticed this issue after eating the chicken from the airplane. Tolerates chicken otherwise.  Avoid eating the "airplane chicken". Asked patient to get ingredient list.   Return in about 1 year (around 06/16/2020).  Monitor BP.  Take picture of the bump on the back. If worsens, follow up with PCP.   Diagnostics: None.  Medication List:  Current Outpatient Medications  Medication Sig Dispense Refill  . Azelastine HCl 0.15 % SOLN Take 1-2 sprays per nostril 1-2 times a day as needed for post nasal drip. 30 mL 5  . lovastatin (MEVACOR) 20 MG tablet 1 TABLET WITH A MEAL ONCE A DAY ORALLY 90  3  . valACYclovir (VALTREX) 500 MG tablet TAKE 1 TABLET BY MOUTH EVERY DAY     No current facility-administered  medications for this visit.    Allergies: Allergies  Allergen Reactions  . Penicillins Rash    UNSURE IF REAL ALLERGY,  PATIENT DOES NOT WANT TO TAKE A CHANCE TAKING MEDICATION AND HAVING A REACTION unknown   . Sulfamethoxazole Rash   I reviewed his past medical history, social history, family history, and environmental history and no significant changes have been reported from his previous visit.  Review of Systems   Constitutional: Negative for appetite change, chills, fever and unexpected weight change.  HENT: Negative for congestion and rhinorrhea.   Eyes: Negative for itching.  Respiratory: Positive for cough. Negative for chest tightness, shortness of breath and wheezing.   Cardiovascular: Negative for chest pain.  Gastrointestinal: Negative for abdominal pain.  Genitourinary: Negative for difficulty urinating.  Skin: Negative for rash.  Allergic/Immunologic: Positive for environmental allergies.  Neurological: Negative for headaches.   Objective: BP (!) 150/90 (BP Location: Left Arm, Patient Position: Sitting, Cuff Size: Large)   Pulse 66   Temp 98.2 F (36.8 C) (Temporal)   Resp 16   Ht 5' 11.26" (1.81 m)   Wt 231 lb 12.8 oz (105.1 kg)   SpO2 97%   BMI 32.09 kg/m  Body mass index is 32.09 kg/m. Physical Exam  Constitutional: He is oriented to person, place, and time. He appears well-developed and well-nourished.  HENT:  Head: Normocephalic and atraumatic.  Right Ear: External ear normal.  Left Ear: External ear normal.  Nose: Nose normal.  Mouth/Throat: Oropharynx is clear and moist.  Some mild cobblestoning on the pharynx  Eyes: Conjunctivae and EOM are normal.  Neck: Neck supple.  Cardiovascular: Normal rate, regular rhythm and normal heart sounds. Exam reveals no gallop and no friction rub.  No murmur heard. Pulmonary/Chest: Effort normal and breath sounds normal. He has no wheezes. He has no rales.  Abdominal: Soft.  Neurological: He is alert and oriented to person, place, and time.  Skin: Skin is warm and dry. No rash noted.  3x3 raised dry flesh colored bump on the middle of the back. No erythema.   Psychiatric: He has a normal mood and affect. His behavior is normal.  Nursing note and vitals reviewed.  Previous notes and tests were reviewed. The plan was reviewed with the patient/family, and all questions/concerned were addressed.  It was my pleasure to see Joe Crawford  today and participate in his care. Please feel free to contact me with any questions or concerns.  Sincerely,  Rexene Alberts, DO Allergy & Immunology  [facility] office: [phone] [facility] office: [place] office: [phone]

## 2019-06-17 NOTE — Assessment & Plan Note (Signed)
Past history - Perennial rhinitis symptoms for many years mainly occurring in the mornings. Tried saline spray with some benefit. No previous ENT evaluation. 2020 skin testing showed: Positive to molds. Interim history - using azelastine prn with good benefit.   Continue environmental control measures.   May use azelastine nasal spray 1-2 sprays in each nostril 1-2 times a day as needed for drainage.  Nasal saline spray (i.e., Simply Saline) or nasal saline lavage (i.e., NeilMed) is recommended as needed and prior to medicated nasal sprays.  If this regimen does not control symptoms then will try Atrovent nasal spray next and refer to ENT.

## 2019-06-17 NOTE — Assessment & Plan Note (Signed)
Past history - Dry coughing which cleared up with Zithromax and prednisone significantly. Still has few episodes throughout the day. No other respiratory symptoms. 2020 spirometry was normal. Interim history - Minimal coughing. Denies any heartburn symptoms.   Monitor symptoms.

## 2019-08-17 ENCOUNTER — Other Ambulatory Visit: Payer: Self-pay | Admitting: Allergy

## 2021-05-24 ENCOUNTER — Ambulatory Visit
Admission: RE | Admit: 2021-05-24 | Discharge: 2021-05-24 | Disposition: A | Discharge status: Home / Self Care | Payer: Managed Care, Other (non HMO) | Source: Ambulatory Visit | Attending: Sports Medicine | Admitting: Sports Medicine

## 2021-05-24 ENCOUNTER — Other Ambulatory Visit: Payer: Self-pay | Admitting: Sports Medicine

## 2021-05-24 DIAGNOSIS — M25562 Pain in left knee: Secondary | ICD-10-CM

## 2021-05-24 DIAGNOSIS — M25561 Pain in right knee: Secondary | ICD-10-CM

## 2022-05-04 ENCOUNTER — Other Ambulatory Visit: Payer: Self-pay | Admitting: Family Medicine

## 2022-05-04 ENCOUNTER — Ambulatory Visit
Admission: RE | Admit: 2022-05-04 | Discharge: 2022-05-04 | Disposition: A | Discharge status: Home / Self Care | Payer: Managed Care, Other (non HMO) | Source: Ambulatory Visit | Attending: Family Medicine | Admitting: Family Medicine

## 2022-05-04 DIAGNOSIS — R053 Chronic cough: Secondary | ICD-10-CM

## 2022-07-13 ENCOUNTER — Other Ambulatory Visit: Payer: Self-pay | Admitting: Family Medicine

## 2022-07-13 ENCOUNTER — Ambulatory Visit
Admission: RE | Admit: 2022-07-13 | Discharge: 2022-07-13 | Disposition: A | Discharge status: Home / Self Care | Payer: Managed Care, Other (non HMO) | Source: Ambulatory Visit | Attending: Family Medicine | Admitting: Family Medicine

## 2022-07-13 DIAGNOSIS — R053 Chronic cough: Secondary | ICD-10-CM

## 2022-09-23 IMAGING — CR DG KNEE 1-2V*R*
2 series · 2 of 2 positions shown · non-contrast
Comparison: None.

CLINICAL DATA: Knee pain

EXAM:
RIGHT KNEE - 1-2 VIEW

[t knee ap right]
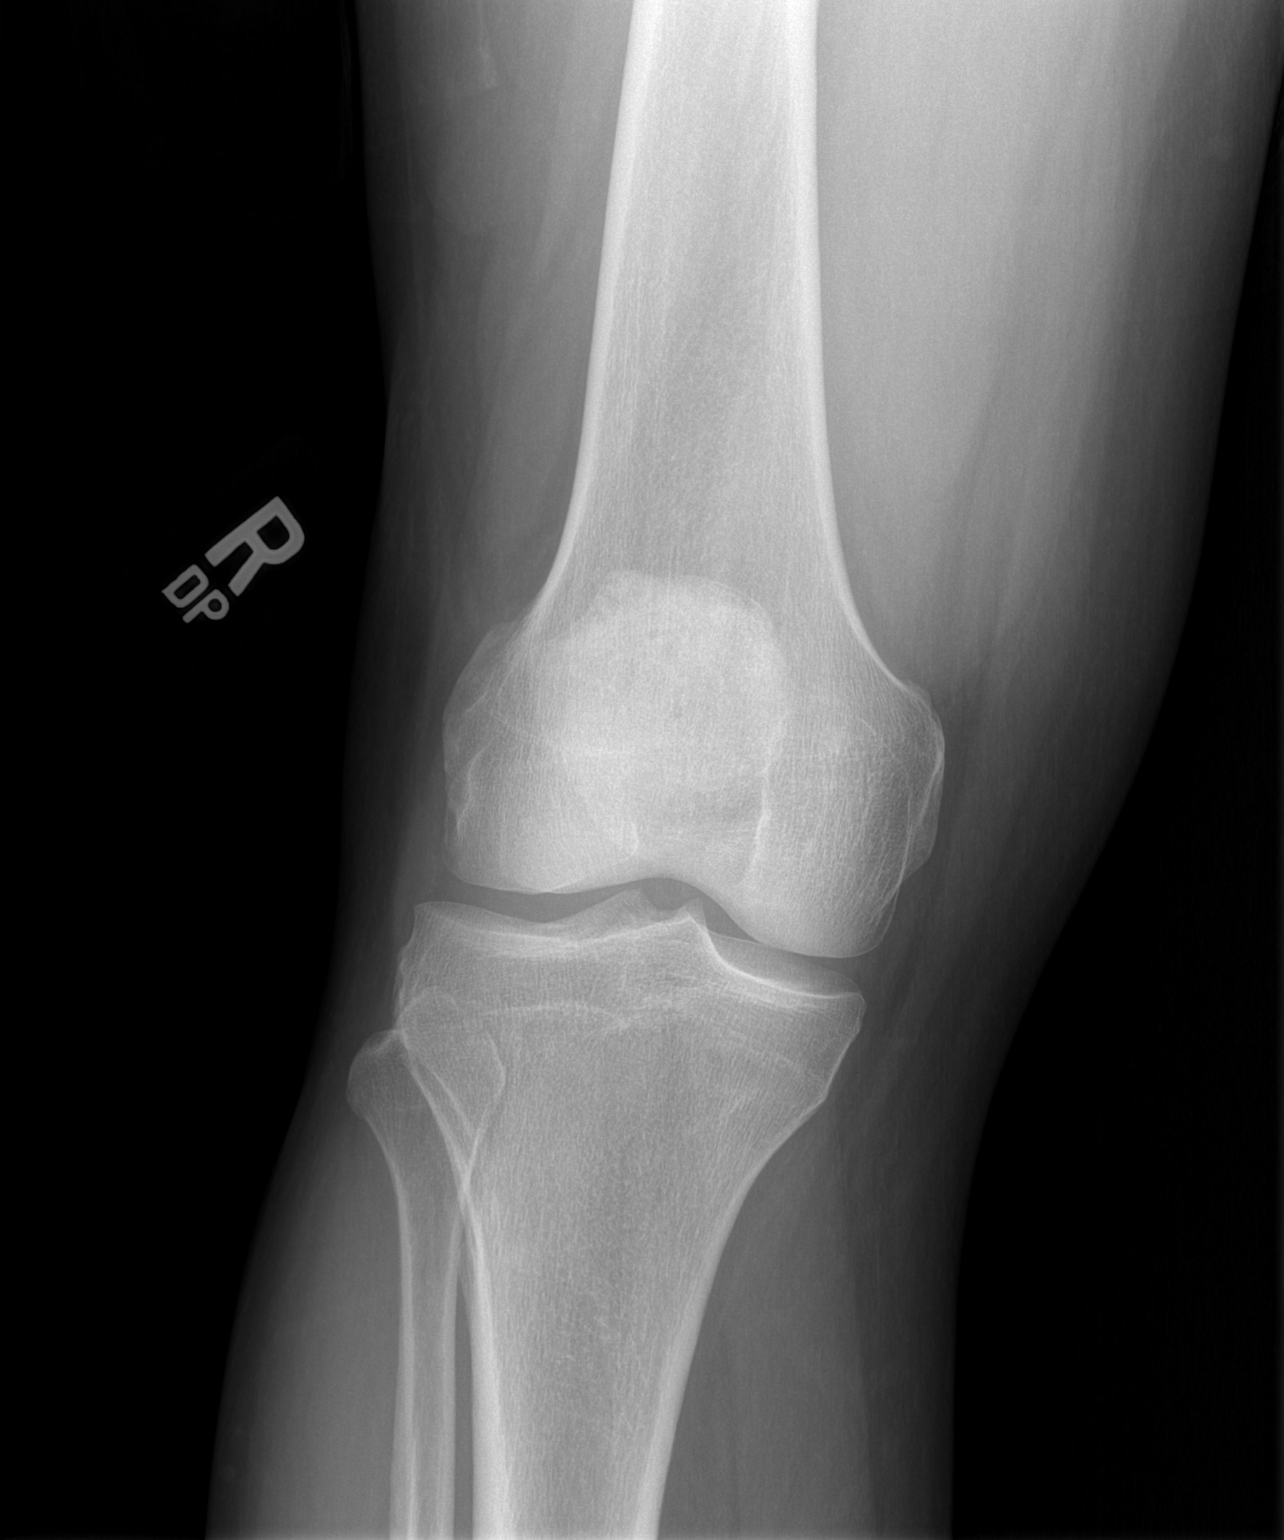

[t knee oblique right]
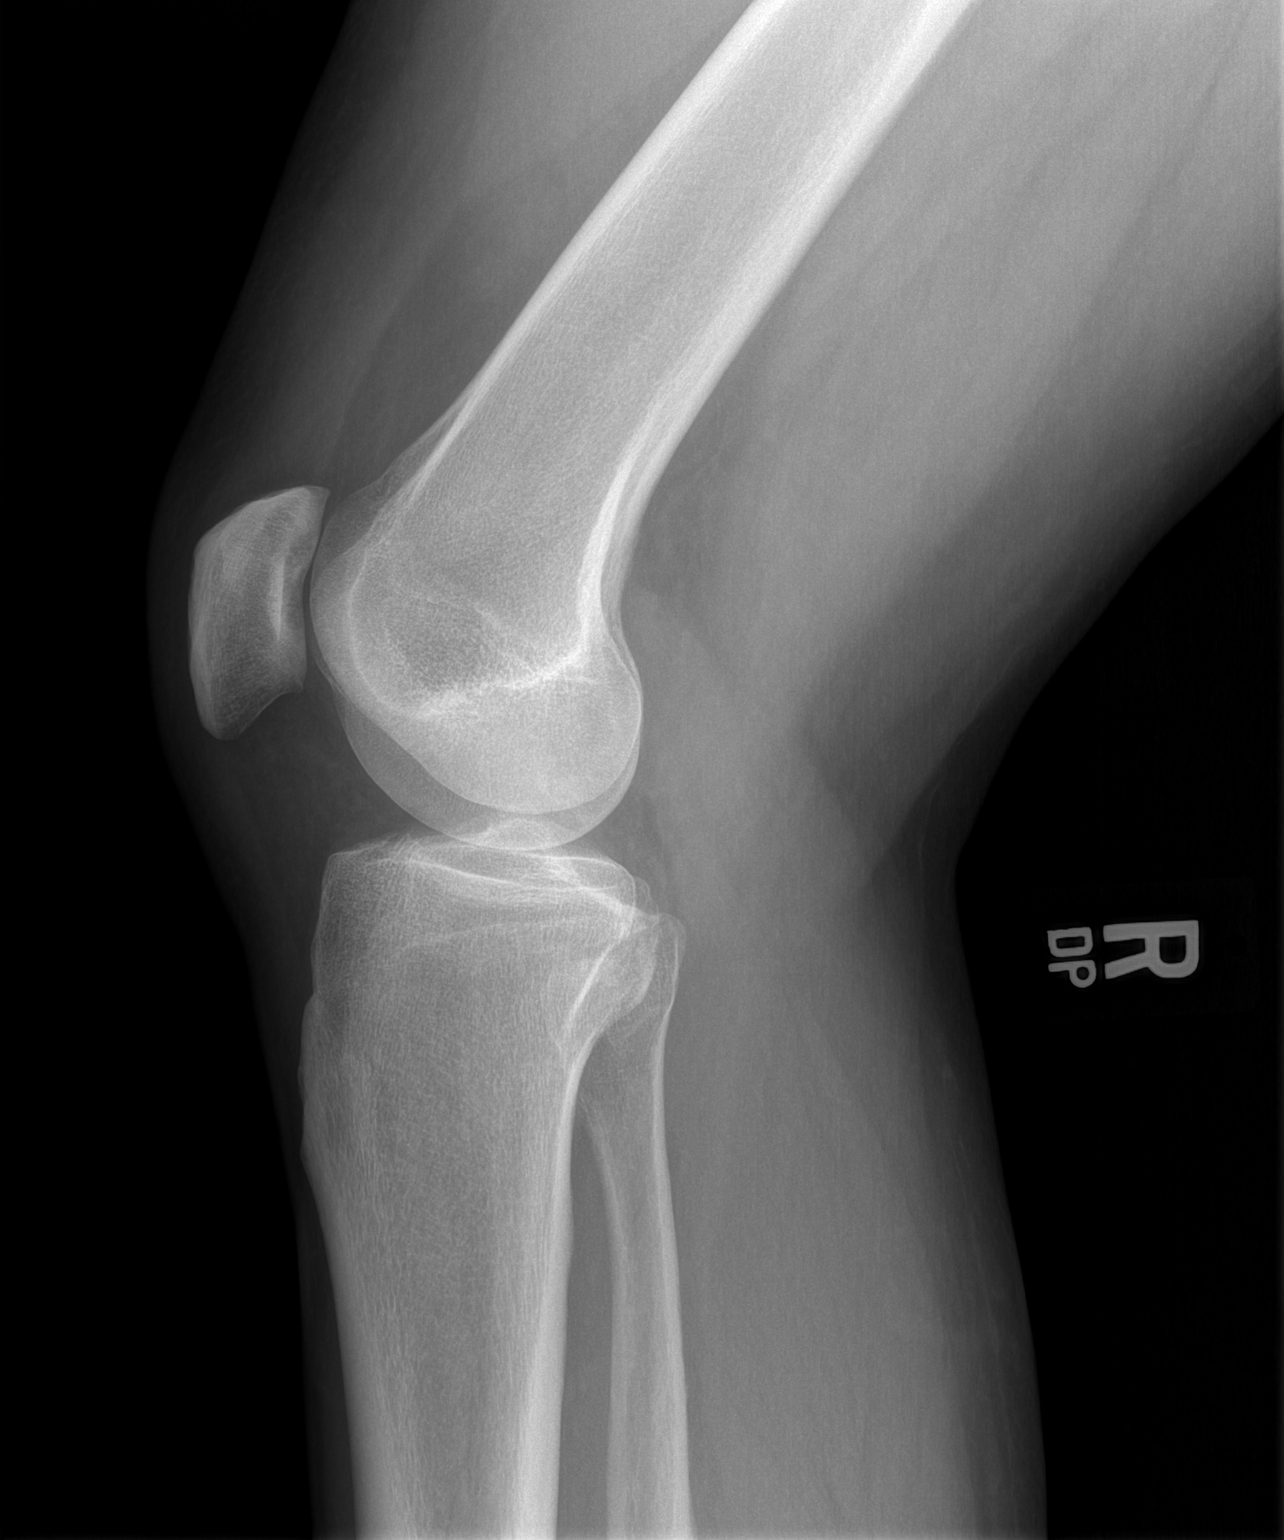

[2 of 2 positions shown; findings below may reference images not displayed]

FINDINGS: No evidence of fracture, dislocation, or joint effusion. No evidence
of arthropathy or other focal bone abnormality. Soft tissues are
unremarkable.
IMPRESSION: Negative.

## 2022-09-23 IMAGING — CR DG KNEE 1-2V*L*
2 series · 2 of 2 positions shown · non-contrast
Comparison: None.

CLINICAL DATA: Pain in both knees

EXAM:
LEFT KNEE - 1-2 VIEW

[t knee ap left]
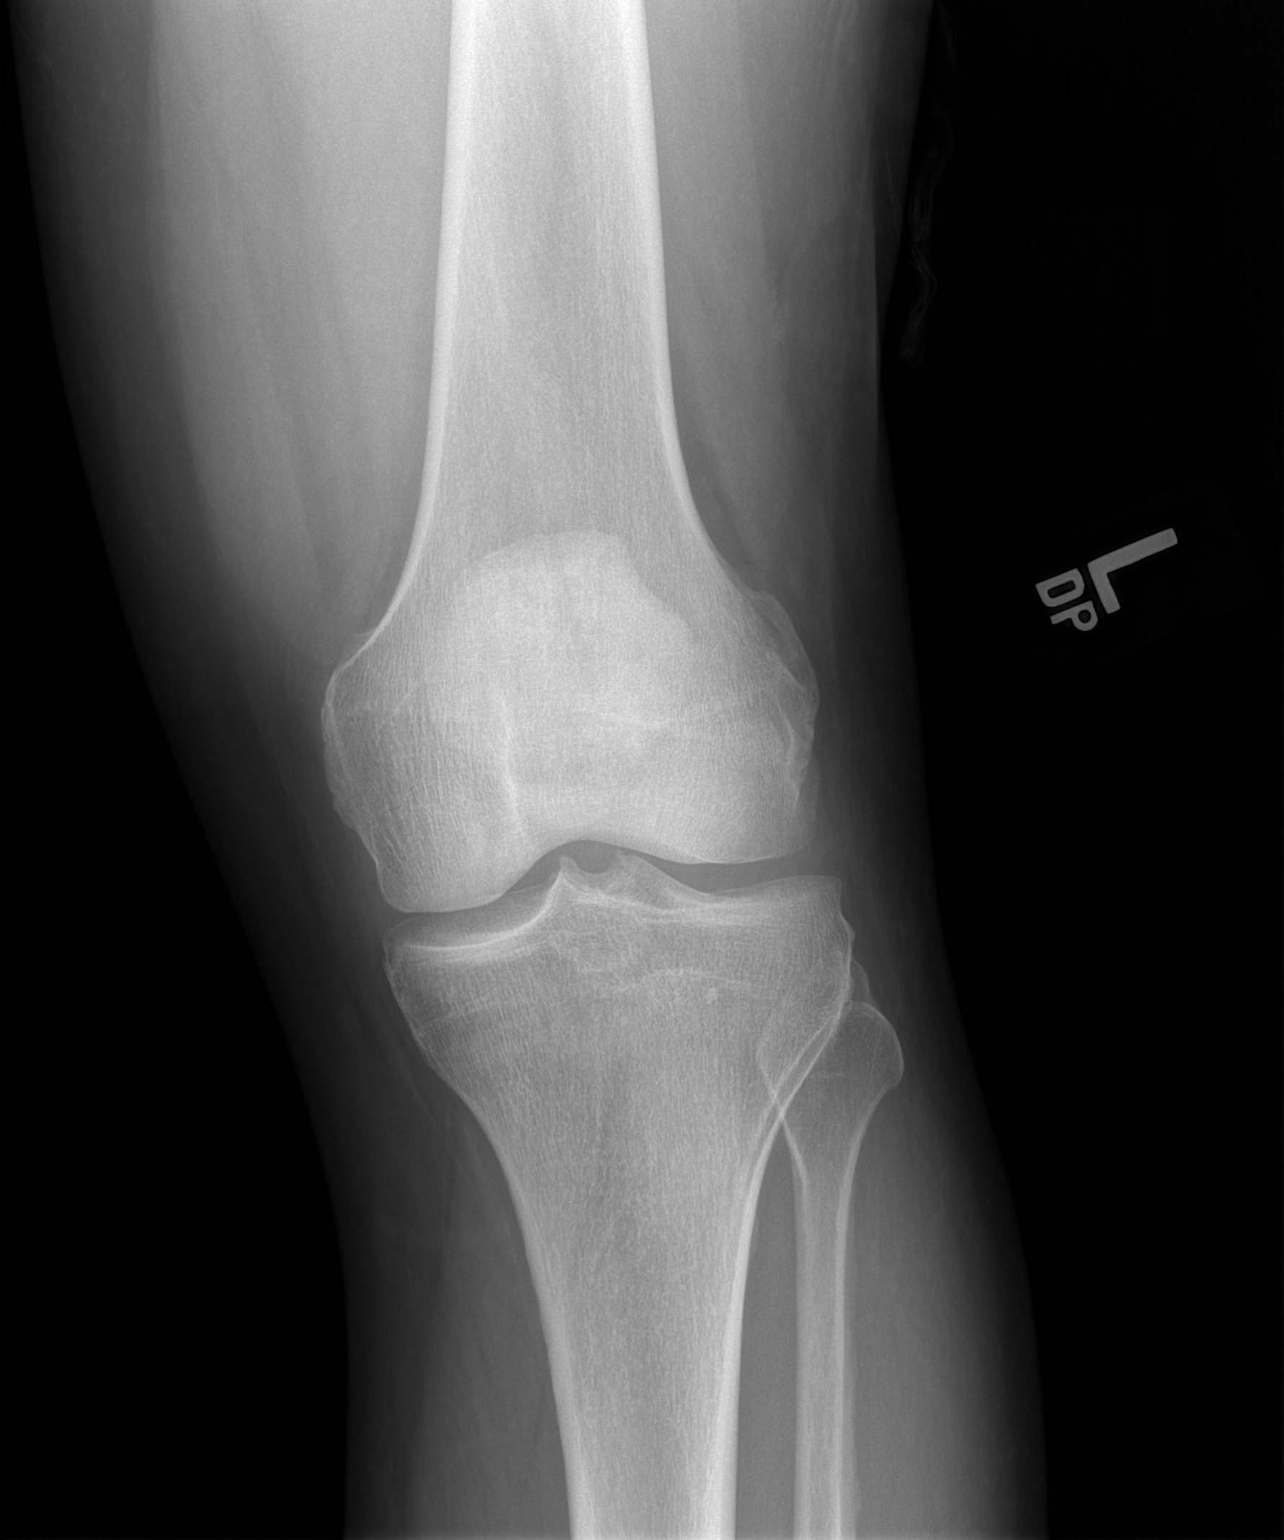

[t knee lat left]
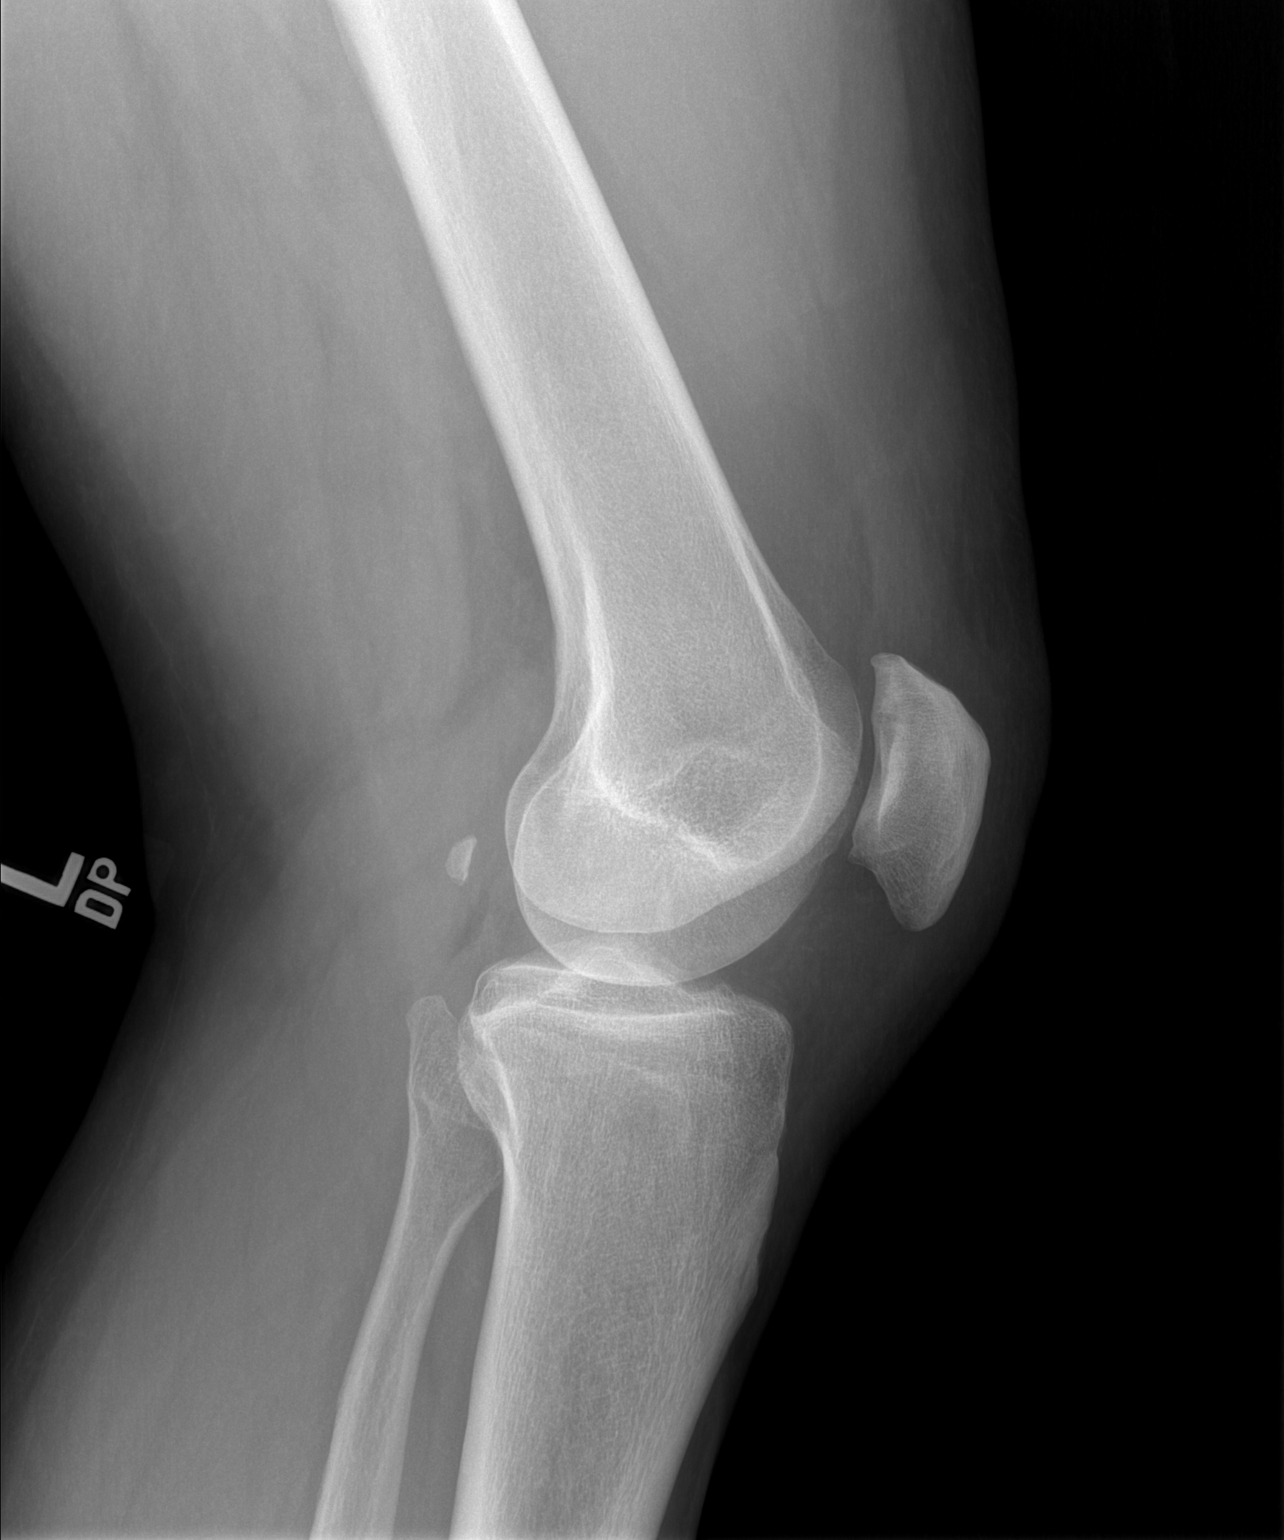

[2 of 2 positions shown; findings below may reference images not displayed]

FINDINGS: Minimal patellofemoral degenerative change. Trace effusion. No
fracture or malalignment
IMPRESSION: Minimal degenerative change

## 2023-01-28 ENCOUNTER — Encounter (HOSPITAL_BASED_OUTPATIENT_CLINIC_OR_DEPARTMENT_OTHER): Payer: Self-pay | Admitting: Emergency Medicine

## 2023-01-28 ENCOUNTER — Emergency Department (HOSPITAL_BASED_OUTPATIENT_CLINIC_OR_DEPARTMENT_OTHER): Payer: Managed Care, Other (non HMO)

## 2023-01-28 ENCOUNTER — Emergency Department (HOSPITAL_BASED_OUTPATIENT_CLINIC_OR_DEPARTMENT_OTHER)
Admission: EM | Admit: 2023-01-28 | Discharge: 2023-01-29 | Disposition: A | Discharge status: Home / Self Care | Payer: Managed Care, Other (non HMO) | Attending: Emergency Medicine | Admitting: Emergency Medicine

## 2023-01-28 DIAGNOSIS — S93401A Sprain of unspecified ligament of right ankle, initial encounter: Secondary | ICD-10-CM | POA: Diagnosis not present

## 2023-01-28 DIAGNOSIS — W010XXA Fall on same level from slipping, tripping and stumbling without subsequent striking against object, initial encounter: Secondary | ICD-10-CM | POA: Insufficient documentation

## 2023-01-28 DIAGNOSIS — Y9301 Activity, walking, marching and hiking: Secondary | ICD-10-CM | POA: Insufficient documentation

## 2023-01-28 DIAGNOSIS — M25571 Pain in right ankle and joints of right foot: Secondary | ICD-10-CM | POA: Diagnosis present

## 2023-01-28 NOTE — ED Triage Notes (Signed)
Trip/ Fall/ tumble down a hill. Pain and swelling to right ankle

## 2023-01-28 NOTE — Discharge Instructions (Signed)
Your x-ray was without any fracture however you do have significant swelling here and your exam is consistent with an ankle sprain.  Ice, Tylenol and ibuprofen can be helpful.  Ultimately if you are still having pain in 2 weeks I recommend following up with an orthopedist for reevaluation and potentially for physical therapy referral.  Please use Tylenol or ibuprofen for pain.  You may use 600 mg ibuprofen every 6 hours or 1000 mg of Tylenol every 6 hours.  You may choose to alternate between the 2.  This would be most effective.  Not to exceed 4 g of Tylenol within 24 hours.  Not to exceed 3200 mg ibuprofen 24 hours.

## 2023-01-28 NOTE — ED Provider Notes (Incomplete)
  [place] EMERGENCY DEPARTMENT AT [facility] Provider Note   CSN: [id number] Arrival date & time: 01/28/23  2118     History {Add pertinent medical, surgical, social history, OB history to HPI:1} Chief Complaint  Patient presents with  . Ankle Injury    Joe Crawford is a 67 y.o. male.   Ankle Injury       Home Medications Prior to Admission medications   Medication Sig Start Date End Date Taking? Authorizing Provider  Azelastine HCl 0.15 % SOLN TAKE 1-2 SPRAYS PER NOSTRIL 1-2 TIMES A DAY AS NEEDED FOR POST NASAL DRIP. 08/19/19   Ellamae Sia, DO  lovastatin (MEVACOR) 20 MG tablet 1 TABLET WITH A MEAL ONCE A DAY ORALLY 90 03/02/18   [provider]  valACYclovir (VALTREX) 500 MG tablet TAKE 1 TABLET BY MOUTH EVERY DAY 10/21/13   [provider]      Allergies    Penicillins and Sulfamethoxazole    Review of Systems   Review of Systems  Physical Exam Updated Vital Signs BP 138/79 (BP Location: Left Arm)   Pulse 60   Temp 98.2 F (36.8 C) (Oral)   Resp 18   SpO2 98%  Physical Exam  ED Results / Procedures / Treatments   Labs (all labs ordered are listed, but only abnormal results are displayed) Labs Reviewed - No data to display  EKG None  Radiology DG Ankle Complete Right  Result Date: 01/28/2023 CLINICAL DATA:  Injury, swelling, and pain.  Larey Seat down a hill. EXAM: RIGHT ANKLE - COMPLETE 3+ VIEW COMPARISON:  Right foot 05/10/2018 FINDINGS: Soft tissue swelling most prominent about the lateral malleolus. No evidence of acute fracture or dislocation. No focal bone lesion or bone destruction. Joint spaces are normal. IMPRESSION: Soft tissue swelling about the right ankle. No acute bony abnormalities. Electronically Signed   By: Burman Nieves M.D.   On: 01/28/2023 23:24    Procedures Procedures  {Document cardiac monitor, telemetry assessment procedure when appropriate:1}  Medications Ordered in ED Medications - No data to  display  ED Course/ Medical Decision Making/ A&P   {   Click here for ABCD2, HEART and other calculatorsREFRESH Note before signing :1}                          Medical Decision Making Amount and/or Complexity of Data Reviewed Radiology: ordered.   ***  {Document critical care time when appropriate:1} {Document review of labs and clinical decision tools ie heart score, Chads2Vasc2 etc:1}  {Document your independent review of radiology images, and any outside records:1} {Document your discussion with family members, caretakers, and with consultants:1} {Document social determinants of health affecting pt's care:1} {Document your decision making why or why not admission, treatments were needed:1} Final Clinical Impression(s) / ED Diagnoses Final diagnoses:  None    Rx / DC Orders ED Discharge Orders     None

## 2023-01-28 NOTE — ED Provider Notes (Signed)
[place] EMERGENCY DEPARTMENT AT [facility] Provider Note   CSN: [id number] Arrival date & time: 01/28/23  2118     History  Chief Complaint  Patient presents with   Ankle Injury    Joe Crawford is a 67 y.o. male.   Ankle Injury  Patient is a 67 year old male present emergency room today with complaints of right outer ankle pain after he inverted his ankle while walking down a hill this caused him to fall to the ground however he states he did not sustain any other injuries he did not strike his head or lose consciousness denies any nausea or vomiting no back neck pain or chest or abdominal pain.  No hip pain or knee pain.  He was able to get up and hobble after the incident.  No other injuries.  Denies any lacerations abrasions or bleeding     Home Medications Prior to Admission medications   Medication Sig Start Date End Date Taking? Authorizing Provider  Azelastine HCl 0.15 % SOLN TAKE 1-2 SPRAYS PER NOSTRIL 1-2 TIMES A DAY AS NEEDED FOR POST NASAL DRIP. 08/19/19   Ellamae Sia, DO  lovastatin (MEVACOR) 20 MG tablet 1 TABLET WITH A MEAL ONCE A DAY ORALLY 90 03/02/18   [provider]  valACYclovir (VALTREX) 500 MG tablet TAKE 1 TABLET BY MOUTH EVERY DAY 10/21/13   [provider]      Allergies    Penicillins and Sulfamethoxazole    Review of Systems   Review of Systems  Physical Exam Updated Vital Signs BP 129/71   Pulse 61   Temp 98.2 F (36.8 C) (Oral)   Resp 18   SpO2 100%  Physical Exam Vitals and nursing note reviewed.  Constitutional:      General: He is not in acute distress.    Appearance: Normal appearance. He is not ill-appearing.  HENT:     Head: Normocephalic and atraumatic.  Eyes:     General: No scleral icterus.       Right eye: No discharge.        Left eye: No discharge.     Conjunctiva/sclera: Conjunctivae normal.  Pulmonary:     Effort: Pulmonary effort is normal.     Breath sounds: No stridor.   Musculoskeletal:     Comments: Patient has right lateral malleolus tenderness there is swelling here and scant bruising.  No abrasion or laceration.  No bleeding.  No other bony tenderness over joints or long bones of the upper and lower extremities.    No neck or back midline tenderness, step-off, deformity, or bruising. Able to turn head left and right 45 degrees without difficulty.  Full range of motion of upper and lower extremity joints shown after palpation was conducted; with 5/5 symmetrical strength in upper and lower extremities. No chest wall tenderness, no facial or cranial tenderness.   Patient has intact sensation grossly in lower and upper extremities. Intact patellar and ankle reflexes. Patient able to ambulate without difficulty.  Radial and DP pulses palpated BL.    Neurological:     Mental Status: He is alert and oriented to person, place, and time. Mental status is at baseline.     ED Results / Procedures / Treatments   Labs (all labs ordered are listed, but only abnormal results are displayed) Labs Reviewed - No data to display  EKG None  Radiology DG Ankle Complete Right  Result Date: 01/28/2023 CLINICAL DATA:  Injury, swelling, and pain.  Larey Seat  down a hill. EXAM: RIGHT ANKLE - COMPLETE 3+ VIEW COMPARISON:  Right foot 05/10/2018 FINDINGS: Soft tissue swelling most prominent about the lateral malleolus. No evidence of acute fracture or dislocation. No focal bone lesion or bone destruction. Joint spaces are normal. IMPRESSION: Soft tissue swelling about the right ankle. No acute bony abnormalities. Electronically Signed   By: Burman Nieves M.D.   On: 01/28/2023 23:24    Procedures Procedures    Medications Ordered in ED Medications - No data to display  ED Course/ Medical Decision Making/ A&P                             Medical Decision Making Amount and/or Complexity of Data Reviewed Radiology: ordered.   Patient is a 67 year old male present  emergency room today with complaints of right outer ankle pain after he inverted his ankle while walking down a hill this caused him to fall to the ground however he states he did not sustain any other injuries he did not strike his head or lose consciousness denies any nausea or vomiting no back neck pain or chest or abdominal pain.  No hip pain or knee pain.  He was able to get up and hobble after the incident.  No other injuries.  Denies any lacerations abrasions or bleeding  Patient does have right lateral malleolus tenderness x-ray negative for fracture.  Distally neurovascularly intact with good sensation and movement in toes and cap refill less than 2 seconds.  Placed in ASO wrap brace and given crutches.  Tylenol ibuprofen for home ice and elevation   Final Clinical Impression(s) / ED Diagnoses Final diagnoses:  Sprain of right ankle, unspecified ligament, initial encounter    Rx / DC Orders ED Discharge Orders     None         Gailen Shelter, [place] 01/29/23 0030    Paula Libra, MD 01/29/23 [id number]

## 2024-04-09 NOTE — Progress Notes (Signed)
 New Patient Note  RE: Joe Crawford MRN: [id number] DOB: 1956/02/21 Date of Office Visit: 04/10/2024  Consult requested by: Dr. Dayna Primary care provider: Morgan Drivers, MD  Chief Complaint: Establish Care (Would like to be tested for penicillin.)  History of Present Illness: I had the pleasure of seeing [name] for initial evaluation at the [facility] on 04/10/2024. He is a 68 y.o. male, who is referred here by Morgan Drivers, MD for the evaluation of penicillin allergy .  Last seen in 2020 for allergic rhinitis, coughing and adverse food reaction.   Discussed the use of AI scribe software for clinical note transcription with the patient, who gave verbal consent to proceed.    He questions whether he is truly allergic to penicillin, as his mother informed him of an allergy  during childhood without any recollection or evidence of a reaction. He has avoided penicillin since then and has not had any recent exposure.  He has a confirmed allergy  to sulfa drugs, causing hives after about seven days of use, with reactions becoming more severe with subsequent exposures. He does not recall the specific infections treated with sulfa drugs.  He experiences morning mucus production. He previously used Flonase but discontinued it due to dryness, managing symptoms by staying hydrated without further treatment.  He recently had severe diarrhea and speculated that it may have been due to a virus. He also reports occasional chest pain and throat discomfort when eating chicken, described as a 'cotton ball' sensation, leading to vomiting on two occasions. No history of heartburn or reflux.   He has a history of insect stings without allergic reactions and does not frequently require antibiotics.     Assessment and Plan: Primo is a 68 y.o. male with: Penicillin allergy  Multiple drug allergies Unclear childhood penicillin allergy  history from patient's mother.  Worsening sulfa reaction with subsequent exposures. Patient would like to see if allergic to penicillin. Not interested in sulfa work up at this time. Over 90% of people with history of penicillin allergy  which occurred over 10 years ago are found to be non-allergic.  Return for penicillin skin testing and drug challenge.  Other allergic rhinitis 2020 skin testing positive to mold. Has some drainage at times. Declined nasal spray. Continue environmental control measures. Monitor symptoms.  Dysphagia, unspecified type Worsening issues with chicken and feels like at times it gets stuck in his throat and had vomiting episodes x 2. Unlikely to be allergic in nature. Follow up with GI regarding this - recommend to rule out eosinophilic esophagitis.  Return for Drug challenge.  No orders of the defined types were placed in this encounter.  Lab Orders  No laboratory test(s) ordered today    Other allergy  screening: Asthma: no Rhino conjunctivitis: yes 2020 skin testing positive to mold. Some drainage in the mornings.   Hymenoptera allergy : no Urticaria: no Eczema:no History of recurrent infections suggestive of immunodeficency: no  Diagnostics: None.   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     lovastatin (MEVACOR) 20 MG tablet 1 TABLET WITH A MEAL ONCE A DAY ORALLY 90  3   Magnesium Glycinate 120 MG CAPS 6 ccapsules Orally daily     tadalafil (CIALIS) 10 MG tablet 1 tablet as needed Orally Once a day; Duration: 30 days     valACYclovir (VALTREX) 500 MG tablet TAKE 1 TABLET BY MOUTH EVERY DAY     No current facility-administered medications for this visit.   Allergies: Allergies  Allergen Reactions   Penicillins Rash    UNSURE IF REAL ALLERGY ,  PATIENT DOES NOT WANT TO TAKE A CHANCE TAKING MEDICATION AND HAVING A REACTION unknown    Sulfamethoxazole Rash   Social History: Social History   Socioeconomic History   Marital status: Married    Spouse name: Not on file   Number of children: Not on file   Years of education: Not on file   Highest education level: Not on file  Occupational History   Occupation: pilot    Comment: Paediatric nurse  Tobacco Use   Smoking status: Never    Passive exposure: Never   Smokeless tobacco: Never  Vaping Use   Vaping status: Never Used  Substance and Sexual Activity   Alcohol use: Never   Drug use: Never   Sexual activity: Not on file  Other Topics Concern   Not on file  Social History Narrative   Not on file   Social Drivers of Health   Financial Resource Strain: Not on file  Food Insecurity: Low Risk  (12/22/2023)   Received from [facility]   Hunger Vital Sign    Within the past 12 months, you worried that your food would run out before you got money to buy more: Never true    Within the past 12 months, the food you bought just didn't last and you didn't have money to get more. : Never true  Transportation Needs: No Transportation Needs (12/22/2023)   Received from [facility]    In the past 12 months, has lack of reliable transportation kept you from medical appointments, meetings, work or from getting things needed for  daily living? : No  Physical Activity: Not on file  Stress: Not on file  Social Connections: Not on file   Lives in a house. Smoking: denies Occupation: retired  Landscape architect HistorySurveyor, minerals in the house: no Engineer, civil (consulting) in the family room: yes Carpet in the bedroom: yes Heating: electric Cooling: central Pet: no  Family History: Family History  Problem Relation Age of Onset   Urticaria Mother    Atrial fibrillation Mother    Healthy Father    Healthy Sister    Healthy Brother    Problem                               Relation Asthma                                   no Eczema  no Food allergy                           no Allergic rhino conjunctivitis     no  Review of Systems  Constitutional:  Negative for appetite change, chills, fever and unexpected weight change.  HENT:  Positive for postnasal drip. Negative for congestion and rhinorrhea.   Eyes:  Negative for itching.  Respiratory:  Negative for cough, chest tightness, shortness of breath and wheezing.   Cardiovascular:  Negative for chest pain.  Gastrointestinal:  Negative for abdominal pain.  Genitourinary:  Negative for difficulty urinating.  Skin:  Negative for rash.  Allergic/Immunologic: Positive for environmental allergies.  Neurological:  Negative for headaches.    Objective: BP 138/78   Pulse (!) 55   Temp 98.3 F (36.8 C)   Ht 5' 11 (1.803 m)   Wt 213 lb 1.6 oz (96.7 kg)   SpO2 99%   BMI 29.72 kg/m  Body mass index is 29.72 kg/m. Physical Exam Vitals and nursing note reviewed.  Constitutional:      Appearance: Normal appearance. He is well-developed.  HENT:     Head: Normocephalic and atraumatic.     Right Ear: Tympanic membrane and external ear normal.     Left Ear: Tympanic membrane and external ear normal.     Nose: Nose normal.     Mouth/Throat:     Mouth: Mucous membranes are moist.     Pharynx: Oropharynx is clear.  Eyes:      Conjunctiva/sclera: Conjunctivae normal.  Cardiovascular:     Rate and Rhythm: Normal rate and regular rhythm.     Heart sounds: Normal heart sounds. No murmur heard.    No friction rub. No gallop.  Pulmonary:     Effort: Pulmonary effort is normal.     Breath sounds: Normal breath sounds. No wheezing, rhonchi or rales.  Musculoskeletal:     Cervical back: Neck supple.  Skin:    General: Skin is warm.     Findings: No rash.  Neurological:     Mental Status: He is alert and oriented to person, place, and time.  Psychiatric:        Behavior: Behavior normal.    The plan was reviewed with the patient/family, and all questions/concerned were addressed.  It was my pleasure to see Greysen today and participate in his care. Please feel free to contact me with any questions or concerns.  Sincerely,  Orlan Cramp, DO Allergy  & Immunology  [facility]  [place] office: [phone] [facility] office: [phone]

## 2024-04-10 ENCOUNTER — Other Ambulatory Visit: Payer: Self-pay

## 2024-04-10 ENCOUNTER — Encounter: Payer: Self-pay | Admitting: Allergy

## 2024-04-10 ENCOUNTER — Ambulatory Visit: Admitting: Allergy

## 2024-04-10 VITALS — BP 138/78 | HR 55 | Temp 98.3°F | Ht 71.0 in | Wt 213.1 lb

## 2024-04-10 DIAGNOSIS — R1310 Dysphagia, unspecified: Secondary | ICD-10-CM

## 2024-04-10 DIAGNOSIS — Z880 Allergy status to penicillin: Secondary | ICD-10-CM

## 2024-04-10 DIAGNOSIS — J3089 Other allergic rhinitis: Secondary | ICD-10-CM | POA: Diagnosis not present

## 2024-04-10 DIAGNOSIS — Z889 Allergy status to unspecified drugs, medicaments and biological substances status: Secondary | ICD-10-CM

## 2024-04-10 DIAGNOSIS — Z888 Allergy status to other drugs, medicaments and biological substances status: Secondary | ICD-10-CM

## 2024-04-10 NOTE — Patient Instructions (Addendum)
 Penicillin allergy : Over 90% of people with history of penicillin allergy  which occurred over 10 years ago are found to be non-allergic.  You must be off antihistamines for 3-5 days before. Must be in good health and not ill. No vaccines/injections/antibiotics within the past 7 days. Plan on being in the office for 2-3 hours and must bring in the drug you want to do the oral challenge for - will send in prescription to pick up a few days before.  Chicken Follow up with GI regarding this - recommend to rule out eosinophilic esophagitis.  Follow up for drug challenge.

## 2024-04-26 ENCOUNTER — Telehealth: Payer: Self-pay | Admitting: Allergy

## 2024-04-26 MED ORDER — AMOXICILLIN 250 MG/5ML PO SUSR: ORAL | 0 refills | Status: DC

## 2024-04-26 NOTE — Telephone Encounter (Signed)
 Please call patient to confirm drug challenge for 05/01/2024.  Must be off antihistamines for 3-5 days before. Must be in good health and not ill. No vaccines/injections within the past 7 days. Not on any antibiotics.   Plan on being in the office for 2-3 hours and must bring in the medication you want to do the oral challenge for - amoxicillin . Rx already sent in.   Thank you.

## 2024-05-01 ENCOUNTER — Ambulatory Visit: Admitting: Allergy

## 2024-05-01 ENCOUNTER — Encounter: Payer: Self-pay | Admitting: Allergy

## 2024-05-01 VITALS — BP 124/86 | HR 59 | Temp 98.2°F | Resp 16 | Ht 67.0 in | Wt 216.2 lb

## 2024-05-01 DIAGNOSIS — Z888 Allergy status to other drugs, medicaments and biological substances status: Secondary | ICD-10-CM | POA: Diagnosis not present

## 2024-05-01 DIAGNOSIS — Z880 Allergy status to penicillin: Secondary | ICD-10-CM | POA: Diagnosis not present

## 2024-05-01 DIAGNOSIS — T50995D Adverse effect of other drugs, medicaments and biological substances, subsequent encounter: Secondary | ICD-10-CM

## 2024-05-01 NOTE — Patient Instructions (Addendum)
 Skin prick and intradermal testing today to penicillin-G, PrePen were negative. Patient received amoxicillin  25mg  and then 475mg  with no clinical reactions.  Will remove penicillin allergy  from drug allergy  list. Discussed that his risk of re-developing penicillin allergy  is the same as the general populations and should not avoid it if he needs to be treated with penicillin in the future.   Continue to avoid sulfa for now.  Follow up as needed.

## 2024-05-01 NOTE — Progress Notes (Signed)
 Follow Up Note  RE: Joe Crawford MRN: [id number] DOB: 08-Mar-1956 Date of Office Visit: 05/01/2024  Referring provider: Morgan Drivers, MD Primary care provider: Morgan Drivers, MD  Chief Complaint: Food/Drug Challenge (Amoxicillin  250 mL/5 mL)  Assessment and Plan: Trumaine is a 68 y.o. male with: Penicillin allergy  Skin prick and intradermal testing today to penicillin-G, PrePen were negative. Patient received amoxicillin  25mg  and then 475mg  with no clinical reactions and was observed 1 hour after the last dose with no issues.  Will remove penicillin allergy  from drug allergy  list. May take penicillin type of antibiotics in the future if needed.  Discussed that his risk of re-developing penicillin allergy  is the same as the general populations and should not avoid it if he needs to be treated with penicillin in the future.   Continue to avoid sulfa for now.  Return if symptoms worsen or fail to improve.  Plan: Challenge drug: amoxicillin  Challenge as per protocol: Passed Total time: 85 min  History of Present Illness: I had the pleasure of seeing Joe Crawford for a follow up visit at the [facility] on 05/01/2024. He is a 68 y.o. male, who is being followed for multiple drug allergies, allergic rhinitis. His previous allergy  office visit was on 04/10/2024 with Dr. Luke. Today he is here for penicillin drug testing and challenge.   Discussed the use of AI scribe software for clinical note transcription with the patient, who gave verbal consent to proceed.    He is undergoing penicillin allergy  testing because his mother had always told him he was allergic to penicillin, but he is unsure of any specific reaction he may have had.  He has a known allergy  to sulfa drugs, which causes hives after seven days of use.     Interval History: Patient has not been ill, he has not had any accidental exposures to the culprit medication.   Recent/Current  History: Pulmonary disease: no Cardiac disease: no Respiratory infection: no Rash: no Itch: no Swelling: no Cough: no Shortness of breath: no Runny/stuffy nose: no Itchy eyes: no Beta-blocker use: no  Patient/guardian was informed of the test procedure with verbalized understanding of the risk of anaphylaxis. Consent was signed.   Last antihistamine use: none in the past 3 days Last beta-blocker use: n/a  Medication List:  Current Outpatient Medications  Medication Sig Dispense Refill   amoxicillin  (AMOXIL ) 250 MG/5ML suspension Do NOT take at home. Bring to Dr. Mariella office for drug challenge. Keep in fridge. 80 mL 0   cyanocobalamin (VITAMIN B12) 500 MCG tablet 1 tablet Orally Once a day     losartan (COZAAR) 100 MG tablet Take 100 mg by mouth daily.     lovastatin (MEVACOR) 20 MG tablet 1 TABLET WITH A MEAL ONCE A DAY ORALLY 90  3   Magnesium Glycinate 120 MG CAPS 6 ccapsules Orally daily     tadalafil (CIALIS) 10 MG tablet 1 tablet as needed Orally Once a day; Duration: 30 days     valACYclovir (VALTREX) 500 MG tablet TAKE 1 TABLET BY MOUTH EVERY DAY     No current facility-administered medications for this visit.   Allergies: Allergies  Allergen Reactions   Sulfamethoxazole Rash   I reviewed his past medical history, social history, family history, and environmental history and no significant changes have been reported from his previous visit.  Review of Systems  Constitutional:  Negative for appetite change, chills, fever and unexpected weight change.  HENT:  Negative for congestion, postnasal drip and rhinorrhea.   Eyes:  Negative for itching.  Respiratory:  Negative for cough, chest tightness, shortness of breath and wheezing.   Cardiovascular:  Negative for chest pain.  Gastrointestinal:  Negative for abdominal pain.  Genitourinary:  Negative for difficulty urinating.  Skin:  Negative for rash.  Allergic/Immunologic: Positive for environmental allergies.   Neurological:  Negative for headaches.   Objective: BP 124/86 (BP Location: Left Arm, Patient Position: Sitting, Cuff Size: Normal)   Pulse (!) 59   Temp 98.2 F (36.8 C) (Temporal)   Resp 16   Ht 5' 7 (1.702 m)   Wt 216 lb 3.2 oz (98.1 kg)   SpO2 97%   BMI 33.86 kg/m  Body mass index is 33.86 kg/m. Physical Exam Vitals and nursing note reviewed.  Constitutional:      Appearance: Normal appearance. He is well-developed.  HENT:     Head: Normocephalic and atraumatic.     Right Ear: Tympanic membrane and external ear normal.     Left Ear: Tympanic membrane and external ear normal.     Nose: Nose normal.     Mouth/Throat:     Mouth: Mucous membranes are moist.     Pharynx: Oropharynx is clear.  Eyes:     Conjunctiva/sclera: Conjunctivae normal.  Cardiovascular:     Rate and Rhythm: Normal rate and regular rhythm.     Heart sounds: Normal heart sounds. No murmur heard.    No friction rub. No gallop.  Pulmonary:     Effort: Pulmonary effort is normal.     Breath sounds: Normal breath sounds. No wheezing, rhonchi or rales.  Musculoskeletal:     Cervical back: Neck supple.  Skin:    General: Skin is warm.     Findings: No rash.  Neurological:     Mental Status: He is alert and oriented to person, place, and time.  Psychiatric:        Behavior: Behavior normal.     Diagnostics: Negative to pre pen and pen G. Results discussed with patient/family.  Oral Challenge - 05/01/24 1100     Challenge Food/Drug Amoxicillin  250mg /92mL    Lot #  if Applicable --   NDC [id number] EXP 12/2024   Food/Drug provided by patient    BP 124/86    Pulse 59    Respirations 16    Lungs 97%    Skin clear    Mouth clear    Time 0928    Dose 0.5 ml    Skin clear    Mouth clear    Time 0945    Dose 9.5 ml    Skin clear    Mouth clear    Time 1053    Dose Final vitals    BP 120/78    Pulse 58    Respirations 16    Lungs 98%    Skin clear    Mouth clear           Penicillin - 05/01/24 1117     Manufacturer [employer]    Lot # [id number]    Location Arm    Number of allergen test 4    Time Testing Placed 0905    Control SPT Negative    Histamine SPT Negative    Pre-Pen Puncture Omitted    Penicillin-G 5000 u/ml SPT Omitted    Pre-Pen Intradermal Negative    Time Testing Placed 0905    Penicillin-G 5000 u/ml Intradermal Negative  Previous notes and tests were reviewed. The plan was reviewed with the patient/family, and all questions/concerned were addressed.  It was my pleasure to see Joe Crawford today and participate in his care. Please feel free to contact me with any questions or concerns.  Sincerely,  Orlan Cramp, DO Allergy  & Immunology  [facility]  [place] office: [phone] [facility] office: [phone]
